# Patient Record
Sex: Female | Born: 2000 | Race: White | Hispanic: No | Marital: Single | Smoking: Never smoker
Health system: Southern US, Community
[De-identification: ages and names within clinical notes are randomized; demographics above are authoritative.]

## PROBLEM LIST (undated history)

## (undated) DIAGNOSIS — F419 Anxiety disorder, unspecified: Secondary | ICD-10-CM

## (undated) DIAGNOSIS — Z8489 Family history of other specified conditions: Secondary | ICD-10-CM

## (undated) DIAGNOSIS — F909 Attention-deficit hyperactivity disorder, unspecified type: Secondary | ICD-10-CM

## (undated) DIAGNOSIS — J45909 Unspecified asthma, uncomplicated: Secondary | ICD-10-CM

## (undated) HISTORY — DX: Unspecified asthma, uncomplicated: J45.909

## (undated) HISTORY — PX: NO PAST SURGERIES: SHX2092

---
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
## 2018-02-13 ENCOUNTER — Telehealth: Payer: Self-pay | Admitting: *Deleted

## 2018-02-16 NOTE — Telephone Encounter (Signed)
Needs appt for flu shot. 

## 2018-05-28 IMAGING — DX DG THORACIC SPINE 2V
4 series · 4 of 4 positions shown · non-contrast
Comparison: None.

CLINICAL DATA: Back pain, initial encounter

EXAM:
THORACIC SPINE 2 VIEWS

[t-spine ap (1 of 2)]
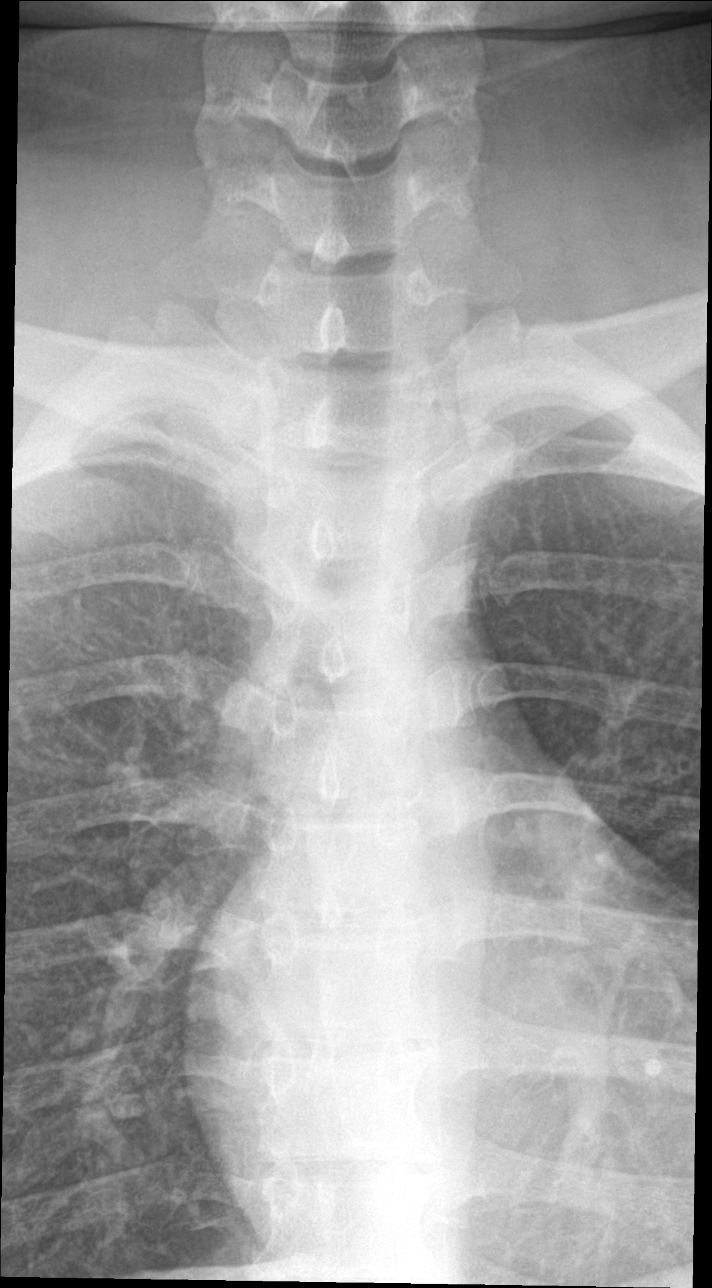

[t-spine lat]
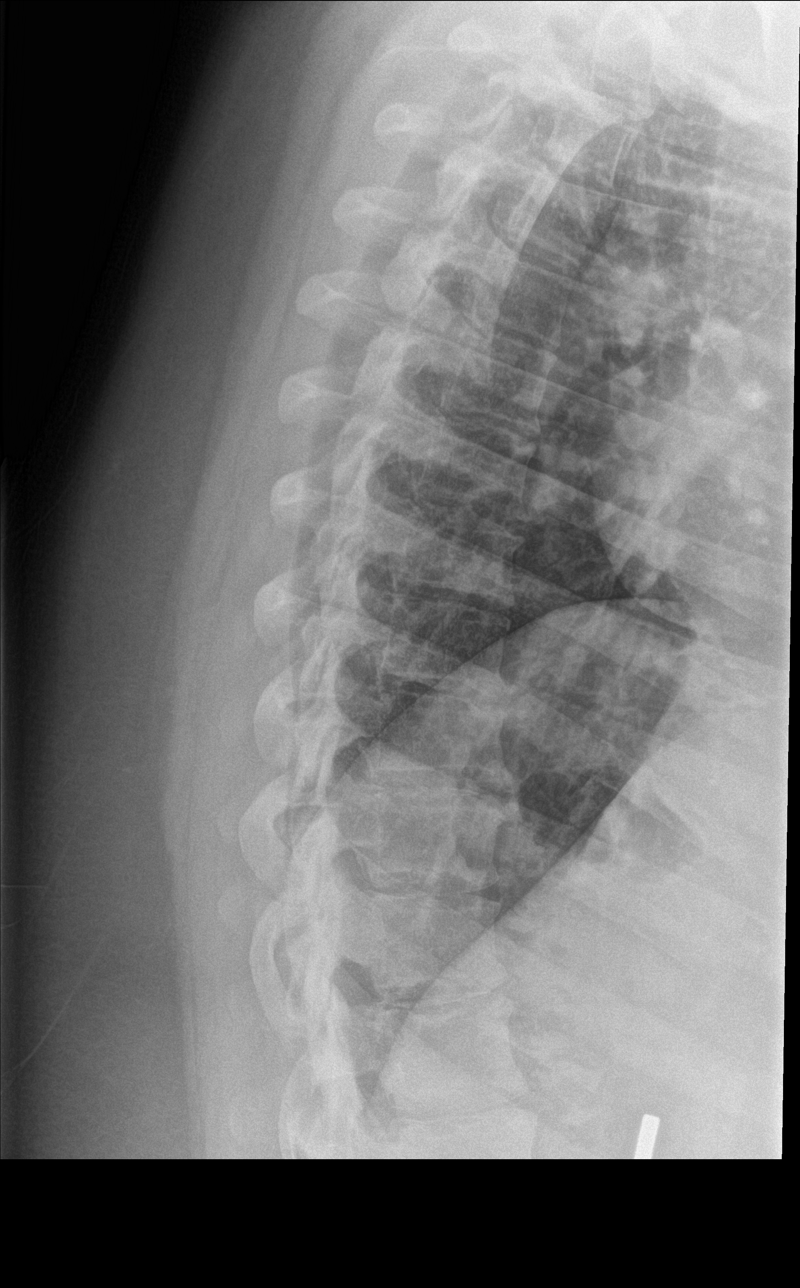

[t-spine lat swimmers]
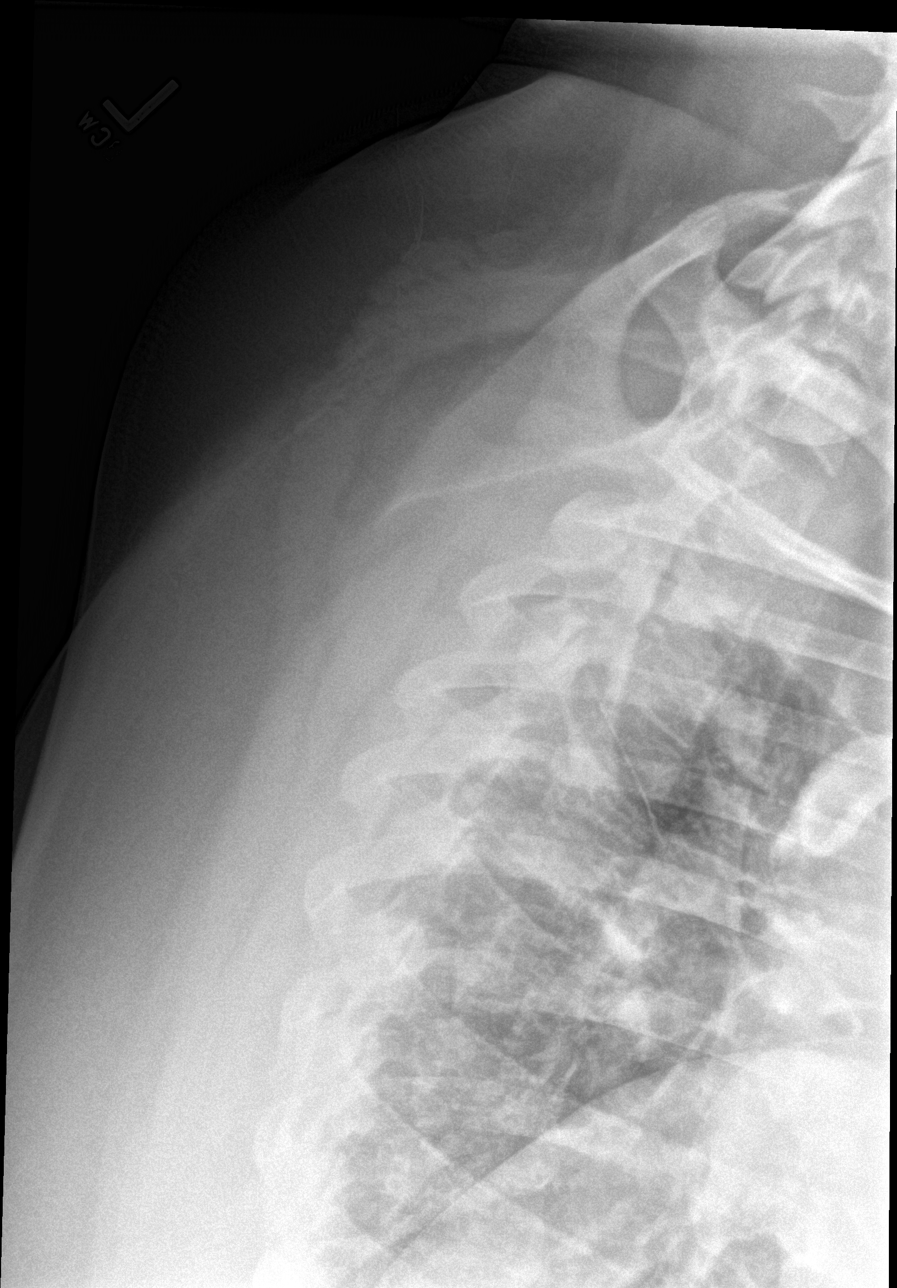

[t-spine ap (2 of 2)]
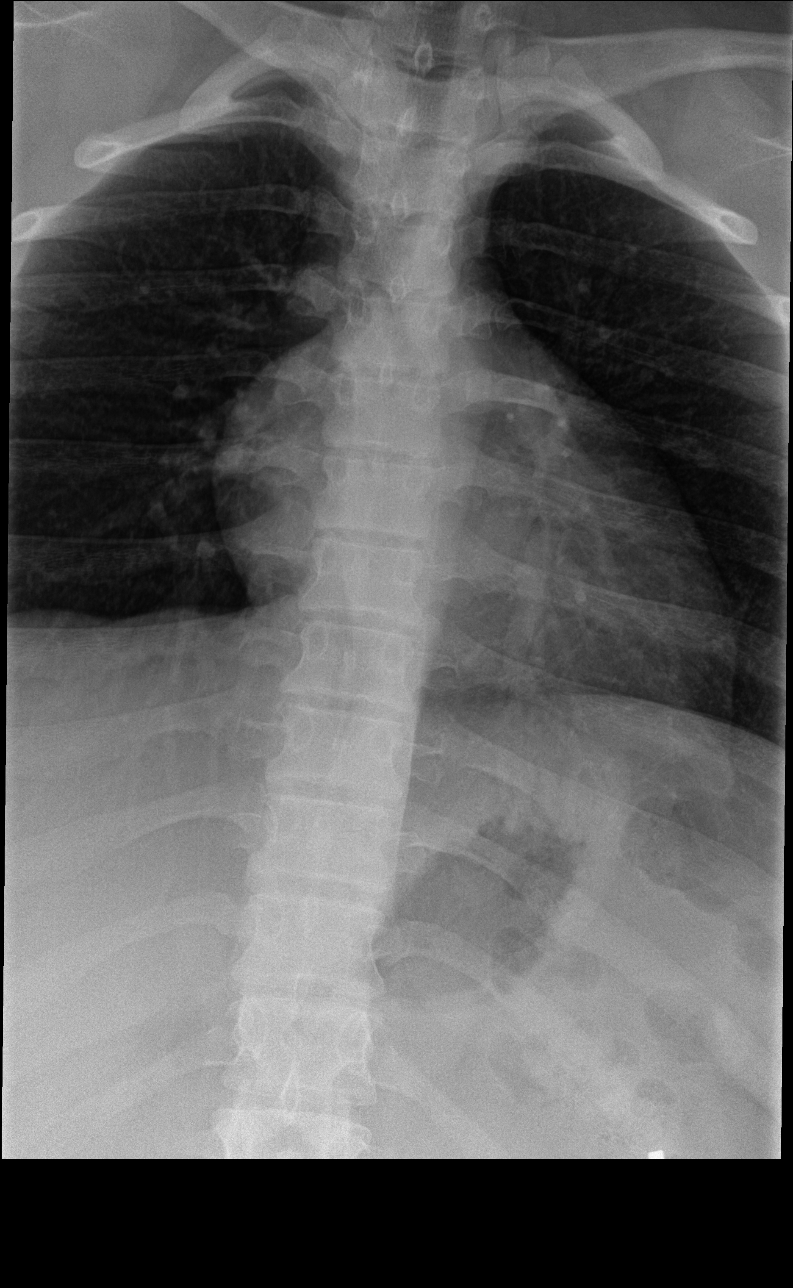

[4 of 4 positions shown; findings below may reference images not displayed]

FINDINGS: Pedicles are within normal limits. Mild osteophytic changes are
noted. No vertebral body height loss is seen. No paraspinal mass
lesion is noted.
IMPRESSION: Mild osteophytic change without acute abnormality.

## 2022-06-09 ENCOUNTER — Encounter (HOSPITAL_COMMUNITY)
Admission: EM | Disposition: A | Discharge status: Home / Self Care | Payer: Self-pay | Source: Home / Self Care | Attending: Family Medicine

## 2022-06-09 ENCOUNTER — Emergency Department (HOSPITAL_COMMUNITY): Payer: Medicaid Other

## 2022-06-09 ENCOUNTER — Emergency Department (HOSPITAL_COMMUNITY): Payer: Medicaid Other | Admitting: Anesthesiology

## 2022-06-09 ENCOUNTER — Other Ambulatory Visit: Payer: Self-pay

## 2022-06-09 ENCOUNTER — Encounter (HOSPITAL_BASED_OUTPATIENT_CLINIC_OR_DEPARTMENT_OTHER): Payer: Self-pay

## 2022-06-09 ENCOUNTER — Emergency Department (HOSPITAL_BASED_OUTPATIENT_CLINIC_OR_DEPARTMENT_OTHER): Payer: Medicaid Other

## 2022-06-09 ENCOUNTER — Inpatient Hospital Stay (HOSPITAL_BASED_OUTPATIENT_CLINIC_OR_DEPARTMENT_OTHER)
Admission: EM | Admit: 2022-06-09 | Discharge: 2022-06-12 | DRG: 660 | Disposition: A | Discharge status: Home / Self Care | Payer: Medicaid Other | Attending: Family Medicine | Admitting: Family Medicine

## 2022-06-09 DIAGNOSIS — J45909 Unspecified asthma, uncomplicated: Secondary | ICD-10-CM | POA: Diagnosis present

## 2022-06-09 DIAGNOSIS — Z83438 Family history of other disorder of lipoprotein metabolism and other lipidemia: Secondary | ICD-10-CM

## 2022-06-09 DIAGNOSIS — F319 Bipolar disorder, unspecified: Secondary | ICD-10-CM | POA: Diagnosis not present

## 2022-06-09 DIAGNOSIS — A419 Sepsis, unspecified organism: Secondary | ICD-10-CM

## 2022-06-09 DIAGNOSIS — N202 Calculus of kidney with calculus of ureter: Secondary | ICD-10-CM | POA: Diagnosis present

## 2022-06-09 DIAGNOSIS — N200 Calculus of kidney: Secondary | ICD-10-CM

## 2022-06-09 DIAGNOSIS — N179 Acute kidney failure, unspecified: Secondary | ICD-10-CM | POA: Insufficient documentation

## 2022-06-09 DIAGNOSIS — E860 Dehydration: Secondary | ICD-10-CM | POA: Diagnosis present

## 2022-06-09 DIAGNOSIS — Z6841 Body Mass Index (BMI) 40.0 and over, adult: Secondary | ICD-10-CM | POA: Diagnosis not present

## 2022-06-09 DIAGNOSIS — K760 Fatty (change of) liver, not elsewhere classified: Secondary | ICD-10-CM | POA: Diagnosis not present

## 2022-06-09 DIAGNOSIS — N138 Other obstructive and reflux uropathy: Secondary | ICD-10-CM

## 2022-06-09 DIAGNOSIS — R7303 Prediabetes: Secondary | ICD-10-CM | POA: Diagnosis present

## 2022-06-09 DIAGNOSIS — N1330 Unspecified hydronephrosis: Secondary | ICD-10-CM | POA: Diagnosis present

## 2022-06-09 DIAGNOSIS — N136 Pyonephrosis: Principal | ICD-10-CM | POA: Diagnosis present

## 2022-06-09 DIAGNOSIS — N139 Obstructive and reflux uropathy, unspecified: Secondary | ICD-10-CM | POA: Insufficient documentation

## 2022-06-09 DIAGNOSIS — N10 Acute tubulo-interstitial nephritis: Secondary | ICD-10-CM | POA: Insufficient documentation

## 2022-06-09 DIAGNOSIS — Z801 Family history of malignant neoplasm of trachea, bronchus and lung: Secondary | ICD-10-CM

## 2022-06-09 DIAGNOSIS — Z8249 Family history of ischemic heart disease and other diseases of the circulatory system: Secondary | ICD-10-CM

## 2022-06-09 DIAGNOSIS — D649 Anemia, unspecified: Secondary | ICD-10-CM | POA: Insufficient documentation

## 2022-06-09 DIAGNOSIS — E871 Hypo-osmolality and hyponatremia: Secondary | ICD-10-CM | POA: Insufficient documentation

## 2022-06-09 DIAGNOSIS — R739 Hyperglycemia, unspecified: Secondary | ICD-10-CM

## 2022-06-09 DIAGNOSIS — Z833 Family history of diabetes mellitus: Secondary | ICD-10-CM

## 2022-06-09 DIAGNOSIS — Z825 Family history of asthma and other chronic lower respiratory diseases: Secondary | ICD-10-CM

## 2022-06-09 DIAGNOSIS — N201 Calculus of ureter: Secondary | ICD-10-CM

## 2022-06-09 HISTORY — PX: CYSTOSCOPY W/ URETERAL STENT PLACEMENT: SHX1429

## 2022-06-09 LAB — COMPREHENSIVE METABOLIC PANEL
ALT: 14 U/L (ref 0–44)
AST: 13 U/L — ABNORMAL LOW (ref 15–41)
Albumin: 4 g/dL (ref 3.5–5.0)
Alkaline Phosphatase: 64 U/L (ref 38–126)
Anion gap: 13 (ref 5–15)
BUN: 5 mg/dL — ABNORMAL LOW (ref 6–20)
CO2: 25 mmol/L (ref 22–32)
Calcium: 9.6 mg/dL (ref 8.9–10.3)
Chloride: 95 mmol/L — ABNORMAL LOW (ref 98–111)
Creatinine, Ser: 0.93 mg/dL (ref 0.44–1.00)
GFR, Estimated: >60 mL/min (ref >60)
Glucose, Bld: 215 mg/dL — ABNORMAL HIGH (ref 70–99)
Potassium: 3.8 mmol/L (ref 3.5–5.1)
Sodium: 133 mmol/L — ABNORMAL LOW (ref 135–145)
Total Bilirubin: 0.9 mg/dL (ref 0.3–1.2)
Total Protein: 7.4 g/dL (ref 6.5–8.1)

## 2022-06-09 LAB — CBC
HCT: 40.4 % (ref 36.0–46.0)
Hemoglobin: 12.9 g/dL (ref 12.0–15.0)
MCH: 28.1 pg (ref 26.0–34.0)
MCHC: 31.9 g/dL (ref 30.0–36.0)
MCV: 88 fL (ref 80.0–100.0)
Platelets: 389 10*3/uL (ref 150–400)
RBC: 4.59 MIL/uL (ref 3.87–5.11)
RDW: 15.7 % — ABNORMAL HIGH (ref 11.5–15.5)
WBC: 31.5 10*3/uL — ABNORMAL HIGH (ref 4.0–10.5)
nRBC: 0 % (ref 0.0–0.2)

## 2022-06-09 LAB — URINALYSIS, MICROSCOPIC (REFLEX)

## 2022-06-09 LAB — URINALYSIS, ROUTINE W REFLEX MICROSCOPIC
Bilirubin Urine: NEGATIVE
Glucose, UA: NEGATIVE mg/dL
Ketones, ur: NEGATIVE mg/dL
Nitrite: NEGATIVE
Protein, ur: 100 mg/dL — AB
Specific Gravity, Urine: 1.015 (ref 1.005–1.030)
pH: 7 (ref 5.0–8.0)

## 2022-06-09 LAB — PREGNANCY, URINE: Preg Test, Ur: NEGATIVE

## 2022-06-09 LAB — LIPASE, BLOOD: Lipase: <10 U/L — ABNORMAL LOW (ref 11–51)

## 2022-06-09 LAB — HCG, SERUM, QUALITATIVE: Preg, Serum: NEGATIVE

## 2022-06-09 SURGERY — CYSTOSCOPY, WITH RETROGRADE PYELOGRAM AND URETERAL STENT INSERTION
Anesthesia: General | Laterality: Left

## 2022-06-09 MED ORDER — FENTANYL CITRATE PF 50 MCG/ML IJ SOSY
12.5000 ug | PREFILLED_SYRINGE | INTRAMUSCULAR | Status: DC | PRN
  Administered 2022-06-09 – 2022-06-10 (×3): 12.5 ug via INTRAVENOUS
  Filled 2022-06-09 (×5): qty 1

## 2022-06-09 MED ORDER — LACTATED RINGERS IV SOLN: INTRAVENOUS | Status: DC

## 2022-06-09 MED ORDER — MEPERIDINE HCL 50 MG/ML IJ SOLN
INTRAMUSCULAR | Status: AC
  Administered 2022-06-09: 12.5 mg via INTRAVENOUS
  Filled 2022-06-09: qty 1

## 2022-06-09 MED ORDER — PROPOFOL 10 MG/ML IV BOLUS
INTRAVENOUS | Status: AC
  Filled 2022-06-09: qty 20

## 2022-06-09 MED ORDER — ACETAMINOPHEN 10 MG/ML IV SOLN
INTRAVENOUS | Status: DC | PRN
  Administered 2022-06-09: 1000 mg via INTRAVENOUS

## 2022-06-09 MED ORDER — DEXAMETHASONE SODIUM PHOSPHATE 10 MG/ML IJ SOLN
INTRAMUSCULAR | Status: AC
  Filled 2022-06-09: qty 1

## 2022-06-09 MED ORDER — LACTATED RINGERS IV BOLUS
1000.0000 mL | Freq: Once | INTRAVENOUS | Status: AC
  Administered 2022-06-09: 1000 mL via INTRAVENOUS

## 2022-06-09 MED ORDER — ONDANSETRON HCL 4 MG/2ML IJ SOLN: 4.0000 mg | Freq: Once | INTRAMUSCULAR | Status: DC | PRN

## 2022-06-09 MED ORDER — CARIPRAZINE HCL 1.5 MG PO CAPS
1.5000 mg | ORAL_CAPSULE | Freq: Every day | ORAL | Status: DC
  Administered 2022-06-10 – 2022-06-12 (×3): 1.5 mg via ORAL
  Filled 2022-06-09 (×3): qty 1

## 2022-06-09 MED ORDER — OXYCODONE HCL 5 MG PO TABS
ORAL_TABLET | ORAL | Status: AC
  Filled 2022-06-09: qty 1

## 2022-06-09 MED ORDER — FENTANYL CITRATE PF 50 MCG/ML IJ SOSY
PREFILLED_SYRINGE | INTRAMUSCULAR | Status: AC
  Administered 2022-06-09: 25 ug via INTRAVENOUS
  Filled 2022-06-09: qty 2

## 2022-06-09 MED ORDER — SODIUM CHLORIDE 0.9 % IR SOLN
Status: DC | PRN
  Administered 2022-06-09: 3000 mL via INTRAVESICAL

## 2022-06-09 MED ORDER — MIDAZOLAM HCL 2 MG/2ML IJ SOLN
INTRAMUSCULAR | Status: DC | PRN
  Administered 2022-06-09: 2 mg via INTRAVENOUS

## 2022-06-09 MED ORDER — IOHEXOL 300 MG/ML  SOLN
INTRAMUSCULAR | Status: DC | PRN
  Administered 2022-06-09: 5 mL

## 2022-06-09 MED ORDER — MORPHINE SULFATE (PF) 4 MG/ML IV SOLN
4.0000 mg | Freq: Once | INTRAVENOUS | Status: AC
  Administered 2022-06-09: 4 mg via INTRAVENOUS
  Filled 2022-06-09: qty 1

## 2022-06-09 MED ORDER — ONDANSETRON HCL 4 MG/2ML IJ SOLN
4.0000 mg | Freq: Once | INTRAMUSCULAR | Status: AC
  Administered 2022-06-09: 4 mg via INTRAVENOUS
  Filled 2022-06-09: qty 2

## 2022-06-09 MED ORDER — OXYCODONE HCL 5 MG/5ML PO SOLN: 5.0000 mg | Freq: Once | ORAL | Status: AC | PRN

## 2022-06-09 MED ORDER — FENTANYL CITRATE (PF) 100 MCG/2ML IJ SOLN
INTRAMUSCULAR | Status: AC
  Filled 2022-06-09: qty 2

## 2022-06-09 MED ORDER — ONDANSETRON HCL 4 MG/2ML IJ SOLN: 4.0000 mg | Freq: Four times a day (QID) | INTRAMUSCULAR | Status: DC | PRN

## 2022-06-09 MED ORDER — SODIUM CHLORIDE 0.9 % IV SOLN
INTRAVENOUS | Status: AC
  Administered 2022-06-11: 1 g via INTRAVENOUS
  Filled 2022-06-09: qty 10

## 2022-06-09 MED ORDER — OXYCODONE HCL 5 MG PO TABS
5.0000 mg | ORAL_TABLET | Freq: Once | ORAL | Status: AC | PRN
  Administered 2022-06-09: 5 mg via ORAL

## 2022-06-09 MED ORDER — FENTANYL CITRATE PF 50 MCG/ML IJ SOSY
25.0000 ug | PREFILLED_SYRINGE | INTRAMUSCULAR | Status: DC | PRN
  Administered 2022-06-09: 50 ug via INTRAVENOUS
  Administered 2022-06-09: 25 ug via INTRAVENOUS
  Administered 2022-06-09: 50 ug via INTRAVENOUS

## 2022-06-09 MED ORDER — ONDANSETRON HCL 4 MG/2ML IJ SOLN
INTRAMUSCULAR | Status: DC | PRN
  Administered 2022-06-09: 4 mg via INTRAVENOUS

## 2022-06-09 MED ORDER — PROPOFOL 500 MG/50ML IV EMUL
INTRAVENOUS | Status: DC | PRN
  Administered 2022-06-09: 220 mg via INTRAVENOUS

## 2022-06-09 MED ORDER — DEXMEDETOMIDINE HCL IN NACL 80 MCG/20ML IV SOLN
INTRAVENOUS | Status: AC
  Filled 2022-06-09: qty 20

## 2022-06-09 MED ORDER — MIDAZOLAM HCL 2 MG/2ML IJ SOLN
INTRAMUSCULAR | Status: AC
  Filled 2022-06-09: qty 2

## 2022-06-09 MED ORDER — 0.9 % SODIUM CHLORIDE (POUR BTL) OPTIME
TOPICAL | Status: DC | PRN
  Administered 2022-06-09: 1000 mL

## 2022-06-09 MED ORDER — DEXAMETHASONE SODIUM PHOSPHATE 10 MG/ML IJ SOLN
INTRAMUSCULAR | Status: DC | PRN
  Administered 2022-06-09: 10 mg via INTRAVENOUS

## 2022-06-09 MED ORDER — ONDANSETRON HCL 4 MG/2ML IJ SOLN
INTRAMUSCULAR | Status: AC
  Filled 2022-06-09: qty 2

## 2022-06-09 MED ORDER — CHLORHEXIDINE GLUCONATE 0.12 % MT SOLN
15.0000 mL | Freq: Once | OROMUCOSAL | Status: AC
  Administered 2022-06-09: 15 mL via OROMUCOSAL

## 2022-06-09 MED ORDER — FENTANYL CITRATE (PF) 100 MCG/2ML IJ SOLN
INTRAMUSCULAR | Status: DC | PRN
  Administered 2022-06-09: 100 ug via INTRAVENOUS

## 2022-06-09 MED ORDER — SODIUM CHLORIDE 0.9 % IV SOLN
1.0000 g | Freq: Once | INTRAVENOUS | Status: AC
  Administered 2022-06-09: 1 g via INTRAVENOUS
  Filled 2022-06-09: qty 10

## 2022-06-09 MED ORDER — DEXMEDETOMIDINE HCL IN NACL 80 MCG/20ML IV SOLN
INTRAVENOUS | Status: DC | PRN
  Administered 2022-06-09: 8 ug via BUCCAL
  Administered 2022-06-09: 12 ug via BUCCAL

## 2022-06-09 MED ORDER — SODIUM CHLORIDE 0.9 % IV SOLN
1.0000 g | INTRAVENOUS | Status: DC
  Administered 2022-06-10 – 2022-06-12 (×2): 1 g via INTRAVENOUS
  Filled 2022-06-09 (×3): qty 10

## 2022-06-09 MED ORDER — DEXTROSE 5 % IV SOLN
INTRAVENOUS | Status: DC | PRN
  Administered 2022-06-09: 1 g via INTRAVENOUS

## 2022-06-09 MED ORDER — FENTANYL CITRATE PF 50 MCG/ML IJ SOSY
PREFILLED_SYRINGE | INTRAMUSCULAR | Status: AC
  Filled 2022-06-09: qty 1

## 2022-06-09 MED ORDER — MEPERIDINE HCL 50 MG/ML IJ SOLN
6.2500 mg | INTRAMUSCULAR | Status: DC | PRN
  Administered 2022-06-09: 12.5 mg via INTRAVENOUS

## 2022-06-09 MED ORDER — ACETAMINOPHEN 10 MG/ML IV SOLN
INTRAVENOUS | Status: AC
  Filled 2022-06-09: qty 100

## 2022-06-09 MED ORDER — KETOROLAC TROMETHAMINE 30 MG/ML IJ SOLN
30.0000 mg | Freq: Once | INTRAMUSCULAR | Status: AC
  Administered 2022-06-09: 30 mg via INTRAVENOUS
  Filled 2022-06-09: qty 1

## 2022-06-09 MED ORDER — LIDOCAINE 2% (20 MG/ML) 5 ML SYRINGE
INTRAMUSCULAR | Status: DC | PRN
  Administered 2022-06-09: 100 mg via INTRAVENOUS

## 2022-06-09 SURGICAL SUPPLY — 17 items
BAG URO CATCHER STRL LF (MISCELLANEOUS) ×1 IMPLANT
BULB IRRIG PATHFIND (MISCELLANEOUS) IMPLANT
CATH FOLEY 2WAY SLVR  5CC 16FR (CATHETERS) ×1
CATH FOLEY 2WAY SLVR 5CC 16FR (CATHETERS) IMPLANT
CATH URETL OPEN END 6FR 70 (CATHETERS) ×1 IMPLANT
CLOTH BEACON ORANGE TIMEOUT ST (SAFETY) ×1 IMPLANT
GLOVE SURG LX STRL 7.5 STRW (GLOVE) ×1 IMPLANT
GOWN STRL REUS W/ TWL XL LVL3 (GOWN DISPOSABLE) ×1 IMPLANT
GOWN STRL REUS W/TWL XL LVL3 (GOWN DISPOSABLE) ×1
GUIDEWIRE STR DUAL SENSOR (WIRE) ×1 IMPLANT
KIT TURNOVER KIT A (KITS) IMPLANT
MANIFOLD NEPTUNE II (INSTRUMENTS) ×1 IMPLANT
PACK CYSTO (CUSTOM PROCEDURE TRAY) ×1 IMPLANT
STENT URET 6FRX26 CONTOUR (STENTS) IMPLANT
SYR 20ML LL LF (SYRINGE) ×1 IMPLANT
TUBING CONNECTING 10 (TUBING) ×1 IMPLANT
TUBING UROLOGY SET (TUBING) IMPLANT

## 2022-06-09 NOTE — Assessment & Plan Note (Addendum)
-  secondary to obstructive uropathy as evidenced with tachycardia, leukocytosis and CT renal stone study finding of mark left sided perinephric stranding with 42mm left UPJ calculus leading to mild LEFT hydronephrosis and signs of moderate to marked renal edema.  -creatinine stable -S/P urgent left JJ stent with urologist Dr. Milford Cage  -continue IV Rocephin pending urine culture -urology will continue to follow. She will eventually need  definitive management for her stone with either lithotripsy or retrograde ureteroscopy and laser lithotripsy.

## 2022-06-09 NOTE — ED Provider Notes (Signed)
[facility] Provider Note   CSN: [id number] Arrival date & time: 06/09/22  1233     History  Chief Complaint  Patient presents with   Abdominal Pain    Katie Ellis is a 22 y.o. female.  HPI   22 year old female presents emergency department with abdominal/flank pain, nausea/vomiting.  Patient states that she has a known kidney stone that is being followed by her primary doctor.  She was given referral for urology however was not able to establish an appointment sooner than 6 weeks from now.  She is also now unable to tolerate any medications due to severe nausea.  She is complaining of worsening pain, difficulty with urination, fever/chills.  Outside of this current episode she has no previous history of kidney stone/surgery.  Home Medications Prior to Admission medications   Medication Sig Start Date End Date Taking? Authorizing Provider  amoxicillin-clavulanate (AUGMENTIN) 875-125 MG tablet Take 1 tablet by mouth 2 (two) times daily. 12/11/17   Claretta Fraise, MD  pseudoephedrine-guaifenesin (MUCINEX D) 60-600 MG 12 hr tablet Take 1 tablet by mouth every 12 (twelve) hours. 12/11/17   Claretta Fraise, MD      Allergies    Patient has no known allergies.    Review of Systems   Review of Systems  Constitutional:  Positive for appetite change, chills, fatigue and fever.  Respiratory:  Negative for shortness of breath.   Cardiovascular:  Negative for chest pain.  Gastrointestinal:  Positive for abdominal pain, nausea and vomiting. Negative for diarrhea.  Genitourinary:  Positive for decreased urine volume, difficulty urinating, dysuria and flank pain.  Skin:  Negative for rash.  Neurological:  Negative for headaches.    Physical Exam Updated Vital Signs BP 136/83   Pulse (!) 137   Temp 99 F (37.2 C) (Oral)   Resp 18   Ht 5\' 6"  (1.676 m)   Wt 132 kg   SpO2 100%   BMI 46.97 kg/m  Physical Exam Vitals and nursing note  reviewed.  Constitutional:      Appearance: Normal appearance. She is obese.  HENT:     Head: Normocephalic.     Mouth/Throat:     Mouth: Mucous membranes are moist.  Cardiovascular:     Rate and Rhythm: Tachycardia present.  Pulmonary:     Effort: Pulmonary effort is normal. No respiratory distress.  Abdominal:     General: Bowel sounds are decreased.     Palpations: Abdomen is soft.     Tenderness: There is abdominal tenderness in the suprapubic area.  Skin:    General: Skin is warm.  Neurological:     Mental Status: She is alert and oriented to person, place, and time. Mental status is at baseline.  Psychiatric:        Mood and Affect: Mood normal.     ED Results / Procedures / Treatments   Labs (all labs ordered are listed, but only abnormal results are displayed) Labs Reviewed  CBC - Abnormal; Notable for the following components:      Result Value   WBC 31.5 (*)    RDW 15.7 (*)    All other components within normal limits  LIPASE, BLOOD  COMPREHENSIVE METABOLIC PANEL  URINALYSIS, ROUTINE W REFLEX MICROSCOPIC  PREGNANCY, URINE  HCG, SERUM, QUALITATIVE    EKG EKG Interpretation  Date/Time:  Thursday June 09 2022 13:08:05 EDT Ventricular Rate:  149 PR Interval:  116 QRS Duration: 83 QT Interval:  279  QTC Calculation: 440 R Axis:   45 Text Interpretation: Sinus tachycardia Low voltage, precordial leads Borderline T abnormalities, anterior leads Confirmed by Lavenia Atlas [phone]) on 06/09/2022 1:51:27 PM  Radiology No results found.  Procedures Procedures    Medications Ordered in ED Medications  lactated ringers bolus 1,000 mL (1,000 mLs Intravenous New Bag/Given 06/09/22 1308)    ED Course/ Medical Decision Making/ A&P                             Medical Decision Making Amount and/or Complexity of Data Reviewed Labs: ordered. Radiology: ordered.  Risk Prescription drug management. Decision regarding hospitalization.   22 year old female  presents emergency department with concern with ongoing kidney stone.  This is reportedly been diagnosed and is being followed by her primary doctor, she has not been able to establish care with urology.  She comes in complaining of worsening left-sided pain, nausea/vomiting, chills.  Patient is tachycardic on arrival, afebrile, stable blood pressure.  She appears uncomfortable and is tender on the left side of the abdomen.  Blood work shows a leukocytosis of 31, kidney function is normal.  Urinalysis shows blood, nitrate negative, no significant amount bacteria/WBCs.  CT shows a 5 mm left-sided obstructive stone with left hydronephrosis and perinephric stranding/inflammation.  Spoke with on-call urologist Dr. Milford Cage, he recommends transfer straight to PACU for stent placement.  Patient and mother state that the last time she had anything to eat/drink was sips of water at noon.  Last food intake was 2 days ago.  They have been notified that she is to be NPO.  Will speak with hospitalist for admission after stent procedure.  Dose of Rocephin ordered, patient requiring IV pain medicine, tachycardia improved but not resolved.  Patients evaluation and results requires admission for further treatment and care.  Spoke with hospitalist, reviewed patient's ED course and they accept admission.  Patient agrees with admission plan, offers no new complaints and is stable/unchanged at time of admit.        Final Clinical Impression(s) / ED Diagnoses Final diagnoses:  None    Rx / DC Orders ED Discharge Orders     None         Lorelle Gibbs, DO 06/09/22 1522

## 2022-06-09 NOTE — ED Triage Notes (Signed)
Reports has a 4mm kidney stone on left side and its moved some. Has some severe abdominal pain on LLQ.

## 2022-06-09 NOTE — ED Notes (Signed)
Pt unable to urinate at this time, states went PTA

## 2022-06-09 NOTE — Assessment & Plan Note (Signed)
BMI of greater than 46

## 2022-06-09 NOTE — Op Note (Signed)
Preoperative diagnosis:  1.  Left proximal ureteral stone with urosepsis   Postoperative diagnosis: 1.  Same  Procedure(s): 1.  Cystoscopy, left retrograde pyelogram with intraoperative interpretation, insertion left JJ stent  Surgeon: Dr. Harold Barban  Anesthesia: General  Complications: None  EBL: Minimal  Specimens: None  Disposition of specimens: Not applicable  Intraoperative findings: 5 mm proximal ureteral calculus.  To migrate proximally insertion of 6 French by 26 cm Percuflex plus soft contour stent.  Purulent drainage noted through and around the stent.  39 French Foley placed  Indication: Patient is a 22 year old white female who has had left proximal ureteral calculus for approximately 3 weeks and is attempted medical management.  She presented with worsening pain and marked leukocytosis and tachycardia consistent with impending urosepsis.  Presents at this time undergo cystoscopy insertion left JJ stent  Description of procedure:  After obtaining informed consent for the patient she was taken the major cystoscopy suite placed under general anesthesia.  She was placed in dorsolithotomy position genitalia prepped and draped in usual sterile fashion.  Proper pause and timeout was performed for site of procedure.  17 French cystoscope was advanced in the bladder.  There is marked hemorrhagic areas within the urinary bladder consistent with cystitis.  No other bladder lesions were noted.  Right ureteral orifice was effluxing clear urine.  Left ureteral orifice was identified.  Open tip catheter was passed through the working channel of the cystoscope and positioned just outside the ureteral orifice.  Sensor wire was directed inside the left ureteral orifice and advanced up to the renal pelvis under fluoroscopy.  The stone could be seen in the region of the left proximal ureter.  I advanced the open tip catheter just beneath the stone and remove the wire.  Gentle retrograde  pyelogram confirmed filling defect in the left proximal ureter consistent with stone seen on CT scan.  I then advanced sensor wire into the renal pelvis and remove the open tip catheter leave the guidewire in place.  The stent seem to migrate proximally with passage of the wire.  A 6 French by 26 cm Percuflex plus soft contour stent was then placed leaving a proximal coil in the renal pelvis and distal coil in the bladder.  Wire was removed.  43 French Foley was placed procedure terminated.  She was awakened from anesthesia and taken back to recovery room in stable condition.  She was somewhat tachycardic and febrile to 101 at termination of the procedure but again vital signs were stable.  Ackley need hemodynamic monitoring overnight with continued IV antibiotics.

## 2022-06-09 NOTE — Hospital Course (Addendum)
Katie Ellis is a 22 y.o. F with morbid obesity, asthma who presented with left sided obstructing stone and pyelonephritis.

## 2022-06-09 NOTE — Anesthesia Procedure Notes (Signed)
Procedure Name: LMA Insertion Date/Time: 06/09/2022 5:51 PM  Performed by: Gerald Leitz, CRNAPre-anesthesia Checklist: Patient identified, Patient being monitored, Timeout performed, Emergency Drugs available and Suction available Patient Re-evaluated:Patient Re-evaluated prior to induction Oxygen Delivery Method: Circle system utilized Preoxygenation: Pre-oxygenation with 100% oxygen Induction Type: IV induction Ventilation: Mask ventilation without difficulty LMA: LMA inserted LMA Size: 4.0 Tube type: Oral Number of attempts: 1 Placement Confirmation: positive ETCO2 and breath sounds checked- equal and bilateral Tube secured with: Tape Dental Injury: Teeth and Oropharynx as per pre-operative assessment

## 2022-06-09 NOTE — Anesthesia Postprocedure Evaluation (Signed)
Anesthesia Post Note  Patient: Katie Ellis  Procedure(s) Performed: CYSTOSCOPY WITH RETROGRADE PYELOGRAM/URETERAL STENT PLACEMENT (Left)     Patient location during evaluation: PACU Anesthesia Type: General Level of consciousness: awake and alert Pain management: pain level controlled Vital Signs Assessment: post-procedure vital signs reviewed and stable Respiratory status: spontaneous breathing, nonlabored ventilation, respiratory function stable and patient connected to nasal cannula oxygen Cardiovascular status: blood pressure returned to baseline and stable Postop Assessment: no apparent nausea or vomiting Anesthetic complications: no  No notable events documented.  Last Vitals:  Vitals:   06/09/22 1845 06/09/22 1900  BP: (!) 110/44 121/89  Pulse: (!) 117 (!) 122  Resp: (!) 28 (!) 22  Temp:    SpO2: 100% 97%    Last Pain:  Vitals:   06/09/22 1900  TempSrc:   PainSc: 6                  [name]

## 2022-06-09 NOTE — Transfer of Care (Signed)
Immediate Anesthesia Transfer of Care Note  Patient: Katie Ellis  Procedure(s) Performed: CYSTOSCOPY WITH RETROGRADE PYELOGRAM/URETERAL STENT PLACEMENT (Left)  Patient Location: PACU  Anesthesia Type:General  Level of Consciousness: drowsy and patient cooperative  Airway & Oxygen Therapy: Patient Spontanous Breathing and Patient connected to face mask oxygen  Post-op Assessment: Report given to RN and Post -op Vital signs reviewed and stable  Post vital signs: Reviewed and stable  Last Vitals:  Vitals Value Taken Time  BP 141/51 06/09/22 1830  Temp    Pulse 114 06/09/22 1833  Resp 30 06/09/22 1833  SpO2 100 % 06/09/22 1833  Vitals shown include unvalidated device data.  Last Pain:  Vitals:   06/09/22 1700  TempSrc: Oral  PainSc: 7          Complications: No notable events documented.

## 2022-06-09 NOTE — Assessment & Plan Note (Addendum)
Prediabetes, A1c 6.4%

## 2022-06-09 NOTE — Anesthesia Preprocedure Evaluation (Signed)
Anesthesia Evaluation  Patient identified by MRN, date of birth, ID band Patient awake    Reviewed: Allergy & Precautions, H&P , NPO status , Patient's Chart, lab work & pertinent test results  Airway Mallampati: III  TM Distance: <3 FB Neck ROM: Full    Dental no notable dental hx.    Pulmonary neg pulmonary ROS   breath sounds clear to auscultation       Cardiovascular negative cardio ROS Normal cardiovascular exam Rhythm:Regular Rate:Normal     Neuro/Psych negative neurological ROS  negative psych ROS   GI/Hepatic negative GI ROS, Neg liver ROS,,,  Endo/Other    Morbid obesity  Renal/GU negative Renal ROS  negative genitourinary   Musculoskeletal negative musculoskeletal ROS (+)    Abdominal  (+) + obese  Peds negative pediatric ROS (+)  Hematology negative hematology ROS (+)   Anesthesia Other Findings   Reproductive/Obstetrics negative OB ROS                             Anesthesia Physical Anesthesia Plan  ASA: 3  Anesthesia Plan: General   Post-op Pain Management: Minimal or no pain anticipated   Induction: Intravenous  PONV Risk Score and Plan: 3 and Ondansetron, Dexamethasone and Treatment may vary due to age or medical condition  Airway Management Planned: LMA  Additional Equipment:   Intra-op Plan:   Post-operative Plan: Extubation in OR  Informed Consent: I have reviewed the patients History and Physical, chart, labs and discussed the procedure including the risks, benefits and alternatives for the proposed anesthesia with the patient or authorized representative who has indicated his/her understanding and acceptance.     Dental advisory given  Plan Discussed with: CRNA and Surgeon  Anesthesia Plan Comments:        Anesthesia Quick Evaluation

## 2022-06-09 NOTE — H&P (Signed)
Urology Consult   Physician requesting consult: Dr. Loleta Books  Reason for consult: Left ureteral calculus  History of Present Illness: Katie Ellis is a 22 y.o. white female who has had known left renal calculus since 05/16/2022.  Set off and on pain with nausea and vomiting but got more severe during the day today.  Was seen at the emergency room and CT urogram confirmed a 5 mm proximal ureteral calculus with perirenal stranding.  Patient has had uncontrolled pain nausea vomiting but no fever.  White blood cell count however is 30,000.  She presents at this time undergo cystoscopy insertion of left JJ stent followed by admission to the hospital service for IV antibiotics.  Will eventually need definitive management for her stone with either lithotripsy or retrograde ureteroscopy and laser lithotripsy.  She denies a history of voiding or storage urinary symptoms, hematuria, UTIs, STDs, urolithiasis, GU malignancy/trauma/surgery.  Past Medical History:  Diagnosis Date   Asthma     History reviewed. No pertinent surgical history.   Current Hospital Medications:  Home meds:  No current facility-administered medications on file prior to encounter.   Current Outpatient Medications on File Prior to Encounter  Medication Sig Dispense Refill   amoxicillin-clavulanate (AUGMENTIN) 875-125 MG tablet Take 1 tablet by mouth 2 (two) times daily. 20 tablet 0   HYDROcodone-acetaminophen (NORCO/VICODIN) 5-325 MG tablet Take 1 tablet by mouth every 6 (six) hours as needed for moderate pain.     pseudoephedrine-guaifenesin (MUCINEX D) 60-600 MG 12 hr tablet Take 1 tablet by mouth every 12 (twelve) hours. 12 tablet 0     Scheduled Meds: Continuous Infusions:  lactated ringers 10 mL/hr at 06/09/22 1711   PRN Meds:.  Allergies: No Known Allergies  Family History  Problem Relation Age of Onset   Diabetes Mother    Hypertension Mother    Hyperlipidemia Mother    Cancer Mother        lung   COPD  Father     Social History:  reports that she is a non-smoker but has been exposed to tobacco smoke. She has never used smokeless tobacco. She reports current drug use. Drug: Marijuana. She reports that she does not drink alcohol.  ROS: A complete review of systems was performed.  All systems are negative except for pertinent findings as noted.  Physical Exam:  Vital signs in last 24 hours: Temp:  [98.5 F (36.9 C)-99 F (37.2 C)] 98.5 F (36.9 C) (03/21 1700) Pulse Rate:  [128-165] 148 (03/21 1700) Resp:  [16-26] 16 (03/21 1700) BP: (121-146)/(75-93) 146/93 (03/21 1700) SpO2:  [97 %-100 %] 97 % (03/21 1700) Weight:  [132 kg] 132 kg (03/21 1700) Constitutional: Obese white female alert and oriented, No acute distress Cardiovascular: Regular rate and rhythm, No JVD Respiratory: Normal respiratory effort, Lungs clear bilaterally GI: Abdomen is soft, nontender, nondistended, no abdominal masses GU: Left CVA tenderness Lymphatic: No lymphadenopathy Neurologic: Grossly intact, no focal deficits Psychiatric: Normal mood and affect  Laboratory Data:  Recent Labs    06/09/22 1307  WBC 31.5*  HGB 12.9  HCT 40.4  PLT 389    Recent Labs    06/09/22 1307  NA 133*  K 3.8  CL 95*  GLUCOSE 215*  BUN 5*  CALCIUM 9.6  CREATININE 0.93     Results for orders placed or performed during the hospital encounter of 06/09/22 (from the past 24 hour(s))  Lipase, blood     Status: Abnormal   Collection Time: 06/09/22  1:07  PM  Result Value Ref Range   Lipase <10 (L) 11 - 51 U/L  Comprehensive metabolic panel     Status: Abnormal   Collection Time: 06/09/22  1:07 PM  Result Value Ref Range   Sodium 133 (L) 135 - 145 mmol/L   Potassium 3.8 3.5 - 5.1 mmol/L   Chloride 95 (L) 98 - 111 mmol/L   CO2 25 22 - 32 mmol/L   Glucose, Bld 215 (H) 70 - 99 mg/dL   BUN 5 (L) 6 - 20 mg/dL   Creatinine, Ser 0.93 0.44 - 1.00 mg/dL   Calcium 9.6 8.9 - 10.3 mg/dL   Total Protein 7.4 6.5 - 8.1 g/dL    Albumin 4.0 3.5 - 5.0 g/dL   AST 13 (L) 15 - 41 U/L   ALT 14 0 - 44 U/L   Alkaline Phosphatase 64 38 - 126 U/L   Total Bilirubin 0.9 0.3 - 1.2 mg/dL   GFR, Estimated >60 >60 mL/min   Anion gap 13 5 - 15  CBC     Status: Abnormal   Collection Time: 06/09/22  1:07 PM  Result Value Ref Range   WBC 31.5 (H) 4.0 - 10.5 K/uL   RBC 4.59 3.87 - 5.11 MIL/uL   Hemoglobin 12.9 12.0 - 15.0 g/dL   HCT 40.4 36.0 - 46.0 %   MCV 88.0 80.0 - 100.0 fL   MCH 28.1 26.0 - 34.0 pg   MCHC 31.9 30.0 - 36.0 g/dL   RDW 15.7 (H) 11.5 - 15.5 %   Platelets 389 150 - 400 K/uL   nRBC 0.0 0.0 - 0.2 %  Pregnancy serum, qualitative     Status: None   Collection Time: 06/09/22  1:36 PM  Result Value Ref Range   Preg, Serum NEGATIVE NEGATIVE  Urinalysis, Routine w reflex microscopic -Urine, Clean Catch     Status: Abnormal   Collection Time: 06/09/22  2:21 PM  Result Value Ref Range   Color, Urine YELLOW YELLOW   APPearance CLEAR CLEAR   Specific Gravity, Urine 1.015 1.005 - 1.030   pH 7.0 5.0 - 8.0   Glucose, UA NEGATIVE NEGATIVE mg/dL   Hgb urine dipstick MODERATE (A) NEGATIVE   Bilirubin Urine NEGATIVE NEGATIVE   Ketones, ur NEGATIVE NEGATIVE mg/dL   Protein, ur 100 (A) NEGATIVE mg/dL   Nitrite NEGATIVE NEGATIVE   Leukocytes,Ua SMALL (A) NEGATIVE  Pregnancy, urine     Status: None   Collection Time: 06/09/22  2:21 PM  Result Value Ref Range   Preg Test, Ur NEGATIVE NEGATIVE  Urinalysis, Microscopic (reflex)     Status: Abnormal   Collection Time: 06/09/22  2:21 PM  Result Value Ref Range   RBC / HPF 0-5 0 - 5 RBC/hpf   WBC, UA 0-5 0 - 5 WBC/hpf   Bacteria, UA FEW (A) NONE SEEN   Squamous Epithelial / HPF 0-5 0 - 5 /HPF   Non Squamous Epithelial PRESENT (A) NONE SEEN   No results found for this or any previous visit (from the past 240 hour(s)).  Renal Function: Recent Labs    06/09/22 1307  CREATININE 0.93   Estimated Creatinine Clearance: 130.2 mL/min (by C-G formula based on SCr of 0.93  mg/dL).  Radiologic Imaging: CT Renal Stone Study  Result Date: 06/09/2022 CLINICAL DATA:  22 year old female presents for evaluation of abdominal pain and flank pain with suspected kidney stone. EXAM: CT ABDOMEN AND PELVIS WITHOUT CONTRAST TECHNIQUE: Multidetector CT imaging of the abdomen and pelvis  was performed following the standard protocol without IV contrast. RADIATION DOSE REDUCTION: This exam was performed according to the departmental dose-optimization program which includes automated exposure control, adjustment of the mA and/or kV according to patient size and/or use of iterative reconstruction technique. COMPARISON:  None available. FINDINGS: Lower chest: Incidental imaging of the lung bases without effusion or sign of consolidative change. No chest wall abnormality. Hepatobiliary: Severe hepatic steatosis with fatty sparing about the gallbladder fossa. Signs of hepatomegaly liver measuring approximately 20 cm greatest craniocaudal dimension. Fissural widening of hepatic fissures. No pericholecystic stranding or gross biliary duct dilation. Pancreas: Normal, without mass, inflammation or ductal dilatation. Spleen: Normal. Adrenals/Urinary Tract: Adrenal glands are normal. Moderate to marked LEFT-sided perinephric stranding with 5 mm LEFT UPJ calculus leading to mild LEFT hydronephrosis and signs of renal edema. No additional urinary tract calculi on either the RIGHT or LEFT. Smooth renal contours. Urinary bladder under distended. Mild perivesical stranding. Stomach/Bowel: Stomach under distended. No sign of small bowel obstruction or acute small bowel process. Appendix is normal. Colon without signs of obstruction or inflammation. Vascular/Lymphatic: Aorta with smooth contours. IVC with smooth contours. No aneurysmal dilation of the abdominal aorta. There is no gastrohepatic or hepatoduodenal ligament lymphadenopathy. No retroperitoneal or mesenteric lymphadenopathy. No pelvic sidewall  lymphadenopathy. Limited assessment of vascular structures due to lack of intravenous contrast. Reproductive: Unremarkable by CT. Other: No ascites.  No pneumoperitoneum. Musculoskeletal: No acute or significant osseous findings. IMPRESSION: 1. Moderate to marked LEFT-sided perinephric stranding with 5 mm LEFT UPJ calculus leading to mild LEFT hydronephrosis and signs of moderate to marked renal edema. Signs of obstructive uropathy which may be worse than the degree of hydronephrosis suggest currently. Given the degree of stranding and edema about the kidney and stranding about the urinary bladder would also suggest correlation with any signs of urinary tract infection. 2. Severe hepatic steatosis and hepatomegaly with fissural widening of hepatic fissures. Findings could reflect early liver disease. Correlate with signs of nonalcoholic steatohepatitis and with any current evidence of liver disease or metabolic syndrome. Electronically Signed   By: Zetta Bills M.D.   On: 06/09/2022 14:54    I independently reviewed the above imaging studies.  Impression/Recommendation: 1.  Left proximal ureteral calculus with severe leukocytosis intractable pain Plan/recommendation.  Plan for cystoscopy insertion of urgent left JJ stent.  She will be admitted to the hospital for observation monitoring for fever follow-up of her leukocytosis risk and benefits of procedure were discussed in detail today including risk of bleeding infection damage to bladder ureter possible need for nephrostomy tube if unable to place ureteral stent.  Patient agreeable to proceed  Remi Haggard 06/09/2022, 5:27 PM     CC:

## 2022-06-09 NOTE — Progress Notes (Signed)
Katie Ellis is a 22 y.o. F with morbid obesity, asthma who presented with left flank pain and abdominal pain.  Evidently has had symptoms ~1 month, saw PCP, given Flomax, Vicodin, referred to Urology, hasn't been yet.  In the meantime, symptoms markedly worse acutely.  In the ER, HR 160 on arrival, tachynpeic, BP normal.  ECG showed sinus tachycardia 149 bpm.  CT renal protocol showed marked LEFT-sided perinephric stranding, 46mm UPJ stone, and hydronephrosis.   Cr normal.  WBC 31K.  UA actually clean.  Got Rocephin, fluids.  To progressive care.  Urology Dr. Milford Cage will arrange for stenting as soon as able.  EDP will assess for need for more fluids, given tachcyardia.

## 2022-06-09 NOTE — H&P (Addendum)
History and Physical    Patient: Katie Ellis DOB: 20-Sep-2000 DOA: 06/09/2022 DOS: the patient was seen and examined on 06/09/2022 PCP: Adaline Sill, NP  Patient coming from:  [facility]  Chief Complaint:  Chief Complaint  Patient presents with   Abdominal Pain   HPI: Katie Ellis is a 22 y.o. female with medical history significant of anxiety, depression, bipolar disorder and morbid obesity who presents from [facility] for urgent stenting by urology for left sided obstructive stone.   Patient had known kidney stone for the past month followed by primary doctor but has not been able to establish care with urology. She presented with worsening left-sided pain, nausea, vomiting and chills.   In ED, she was afebrile, tachycardic and normotensive. WBC of 31K, creatinine stable at 0.93. UA shows blood, negative nitrate, small leukocytes.   CT renal stone study revealed moderate to mark left sided perinephric stranding with 58mm left UPJ calculus leading to mild LEFT hydronephrosis and signs of moderate to marked renal edema.   EDP consulted on-call urologist Dr. Milford Cage who recommended direct transfer to PACU for urgent stent placement.  Hospitalist being called post-op to continue pain management and monitoring tachycardia.    Post-operatively pt has improved abdominal pain and less nausea. Has been able to tolerate oral intake. Continues to be tachycardic with HR up to 130.  Review of Systems: As mentioned in the history of present illness. All other systems reviewed and are negative. Past Medical History:  Diagnosis Date   Asthma    History reviewed. No pertinent surgical history. Social History:  reports that she is a non-smoker but has been exposed to tobacco smoke. She has never used smokeless tobacco. She reports current drug use. Drug: Marijuana. She reports that she does not drink alcohol.  No Known Allergies  Family History  Problem  Relation Age of Onset   Diabetes Mother    Hypertension Mother    Hyperlipidemia Mother    Cancer Mother        lung   COPD Father     Prior to Admission medications   Medication Sig Start Date End Date Taking? Authorizing Provider  amoxicillin-clavulanate (AUGMENTIN) 875-125 MG tablet Take 1 tablet by mouth 2 (two) times daily. 12/11/17  Yes Claretta Fraise, MD  HYDROcodone-acetaminophen (NORCO/VICODIN) 5-325 MG tablet Take 1 tablet by mouth every 6 (six) hours as needed for moderate pain.   Yes [provider]  pseudoephedrine-guaifenesin (MUCINEX D) 60-600 MG 12 hr tablet Take 1 tablet by mouth every 12 (twelve) hours. 12/11/17   Claretta Fraise, MD    Physical Exam: Vitals:   06/09/22 1930 06/09/22 1945 06/09/22 2039 06/09/22 2245  BP: 131/76 131/85 103/69 (!) 125/58  Pulse: (!) 127 (!) 123 (!) 132 (!) 145  Resp: (!) 22 20 18 18   Temp:  100 F (37.8 C) 97.7 F (36.5 C) 98.5 F (36.9 C)  TempSrc:   Oral Oral  SpO2: 98% 100% 100% 100%  Weight:      Height:       Constitutional: NAD, calm, comfortable. Morbid obesity young female laying in bed Eyes: lids and conjunctivae normal ENMT: Mucous membranes are moist.  Neck: normal, supple Respiratory: clear to auscultation bilaterally, no wheezing, no crackles. Normal respiratory effort. No accessory muscle use.  Cardiovascular: Regular rate and rhythm, no murmurs / rubs / gallops. No extremity edema.  Abdomen: soft, non-tender, non-distended  GU: foley catheter in place with pink colored urine  Musculoskeletal: no clubbing / cyanosis. No joint deformity upper and lower extremities. Good ROM, no contractures. Normal muscle tone.  Skin: no rashes, lesions, ulcers. No induration Neurologic: CN 2-12 grossly intact.   Psychiatric: Normal judgment and insight. Alert and oriented x 3. Normal mood. Data Reviewed:  See HPI  Assessment and Plan: * Sepsis ([place]) -secondary to obstructive uropathy as evidenced with tachycardia,  leukocytosis and CT renal stone study finding of mark left sided perinephric stranding with 29mm left UPJ calculus leading to mild LEFT hydronephrosis and signs of moderate to marked renal edema.  -creatinine stable -S/P urgent left JJ stent with urologist Dr. Milford Cage  -continue IV Rocephin pending urine culture -urology will continue to follow. She will eventually need  definitive management for her stone with either lithotripsy or retrograde ureteroscopy and laser lithotripsy.  Hyperglycemia -BG of  215 -Check HA1C  Bipolar disorder (HCC) -continue home Vraylar  Hepatic steatosis -severe hepatic steatosis and hepatomegaly noted on CT  -LFTs are within normal limits -encourage weight loss and follow up outpatient  Obstructive uropathy    Morbid obesity ([place]) BMI of greater than 46      Advance Care Planning: Full  Consults: urology  Family Communication: sister at bedside  Severity of Illness: The appropriate patient status for this patient is OBSERVATION. Observation status is judged to be reasonable and necessary in order to provide the required intensity of service to ensure the patient's safety. The patient's presenting symptoms, physical exam findings, and initial radiographic and laboratory data in the context of their medical condition is felt to place them at decreased risk for further clinical deterioration. Furthermore, it is anticipated that the patient will be medically stable for discharge from the hospital within 2 midnights of admission.   Author: Orene Desanctis, DO 06/09/2022 11:50 PM  For on call review [email or web address].

## 2022-06-09 NOTE — ED Notes (Signed)
Patient transported to CT 

## 2022-06-09 NOTE — Assessment & Plan Note (Addendum)
-   Continue Vraylar

## 2022-06-10 ENCOUNTER — Encounter (HOSPITAL_COMMUNITY): Payer: Self-pay | Admitting: Urology

## 2022-06-10 DIAGNOSIS — N1330 Unspecified hydronephrosis: Secondary | ICD-10-CM | POA: Diagnosis present

## 2022-06-10 DIAGNOSIS — D649 Anemia, unspecified: Secondary | ICD-10-CM | POA: Diagnosis present

## 2022-06-10 DIAGNOSIS — E860 Dehydration: Secondary | ICD-10-CM | POA: Diagnosis present

## 2022-06-10 DIAGNOSIS — Z83438 Family history of other disorder of lipoprotein metabolism and other lipidemia: Secondary | ICD-10-CM | POA: Diagnosis not present

## 2022-06-10 DIAGNOSIS — N179 Acute kidney failure, unspecified: Secondary | ICD-10-CM | POA: Diagnosis present

## 2022-06-10 DIAGNOSIS — N10 Acute tubulo-interstitial nephritis: Secondary | ICD-10-CM | POA: Diagnosis not present

## 2022-06-10 DIAGNOSIS — N139 Obstructive and reflux uropathy, unspecified: Secondary | ICD-10-CM | POA: Diagnosis present

## 2022-06-10 DIAGNOSIS — Z8249 Family history of ischemic heart disease and other diseases of the circulatory system: Secondary | ICD-10-CM | POA: Diagnosis not present

## 2022-06-10 DIAGNOSIS — N132 Hydronephrosis with renal and ureteral calculous obstruction: Secondary | ICD-10-CM

## 2022-06-10 DIAGNOSIS — F319 Bipolar disorder, unspecified: Secondary | ICD-10-CM | POA: Diagnosis present

## 2022-06-10 DIAGNOSIS — J45909 Unspecified asthma, uncomplicated: Secondary | ICD-10-CM | POA: Diagnosis present

## 2022-06-10 DIAGNOSIS — K760 Fatty (change of) liver, not elsewhere classified: Secondary | ICD-10-CM | POA: Diagnosis present

## 2022-06-10 DIAGNOSIS — N202 Calculus of kidney with calculus of ureter: Secondary | ICD-10-CM | POA: Diagnosis present

## 2022-06-10 DIAGNOSIS — E871 Hypo-osmolality and hyponatremia: Secondary | ICD-10-CM | POA: Diagnosis present

## 2022-06-10 DIAGNOSIS — Z801 Family history of malignant neoplasm of trachea, bronchus and lung: Secondary | ICD-10-CM | POA: Diagnosis not present

## 2022-06-10 DIAGNOSIS — Z6841 Body Mass Index (BMI) 40.0 and over, adult: Secondary | ICD-10-CM | POA: Diagnosis not present

## 2022-06-10 DIAGNOSIS — R7303 Prediabetes: Secondary | ICD-10-CM | POA: Diagnosis present

## 2022-06-10 DIAGNOSIS — Z833 Family history of diabetes mellitus: Secondary | ICD-10-CM | POA: Diagnosis not present

## 2022-06-10 DIAGNOSIS — Z825 Family history of asthma and other chronic lower respiratory diseases: Secondary | ICD-10-CM | POA: Diagnosis not present

## 2022-06-10 DIAGNOSIS — N136 Pyonephrosis: Secondary | ICD-10-CM | POA: Diagnosis present

## 2022-06-10 LAB — HIV ANTIBODY (ROUTINE TESTING W REFLEX): HIV Screen 4th Generation wRfx: NONREACTIVE

## 2022-06-10 LAB — CBC
HCT: 33.1 % — ABNORMAL LOW (ref 36.0–46.0)
Hemoglobin: 10.4 g/dL — ABNORMAL LOW (ref 12.0–15.0)
MCH: 28.7 pg (ref 26.0–34.0)
MCHC: 31.4 g/dL (ref 30.0–36.0)
MCV: 91.2 fL (ref 80.0–100.0)
Platelets: 192 10*3/uL (ref 150–400)
RBC: 3.63 MIL/uL — ABNORMAL LOW (ref 3.87–5.11)
RDW: 15.7 % — ABNORMAL HIGH (ref 11.5–15.5)
WBC: 30.8 10*3/uL — ABNORMAL HIGH (ref 4.0–10.5)
nRBC: 0 % (ref 0.0–0.2)

## 2022-06-10 LAB — BASIC METABOLIC PANEL
Anion gap: 11 (ref 5–15)
BUN: 10 mg/dL (ref 6–20)
CO2: 21 mmol/L — ABNORMAL LOW (ref 22–32)
Calcium: 8.2 mg/dL — ABNORMAL LOW (ref 8.9–10.3)
Chloride: 99 mmol/L (ref 98–111)
Creatinine, Ser: 1.46 mg/dL — ABNORMAL HIGH (ref 0.44–1.00)
GFR, Estimated: 51 mL/min — ABNORMAL LOW (ref >60)
Glucose, Bld: 223 mg/dL — ABNORMAL HIGH (ref 70–99)
Potassium: 3.6 mmol/L (ref 3.5–5.1)
Sodium: 131 mmol/L — ABNORMAL LOW (ref 135–145)

## 2022-06-10 MED ORDER — GLUCERNA SHAKE PO LIQD
237.0000 mL | Freq: Two times a day (BID) | ORAL | Status: DC
  Administered 2022-06-10 – 2022-06-11 (×2): 237 mL via ORAL
  Filled 2022-06-10 (×3): qty 237

## 2022-06-10 MED ORDER — ALUM & MAG HYDROXIDE-SIMETH 200-200-20 MG/5ML PO SUSP
30.0000 mL | ORAL | Status: DC | PRN
  Administered 2022-06-10 – 2022-06-11 (×2): 30 mL via ORAL
  Filled 2022-06-10 (×2): qty 30

## 2022-06-10 MED ORDER — ONDANSETRON HCL 4 MG PO TABS: 4.0000 mg | ORAL_TABLET | Freq: Three times a day (TID) | ORAL | Status: DC | PRN

## 2022-06-10 MED ORDER — OXYCODONE HCL 5 MG PO TABS
5.0000 mg | ORAL_TABLET | ORAL | Status: DC | PRN
  Administered 2022-06-10 – 2022-06-11 (×6): 5 mg via ORAL
  Filled 2022-06-10 (×6): qty 1

## 2022-06-10 MED ORDER — LACTATED RINGERS IV BOLUS
1000.0000 mL | Freq: Once | INTRAVENOUS | Status: AC
  Administered 2022-06-10: 1000 mL via INTRAVENOUS

## 2022-06-10 MED ORDER — ONDANSETRON HCL 4 MG/2ML IJ SOLN
4.0000 mg | Freq: Three times a day (TID) | INTRAMUSCULAR | Status: DC | PRN
  Administered 2022-06-10 – 2022-06-11 (×2): 4 mg via INTRAVENOUS
  Filled 2022-06-10 (×2): qty 2

## 2022-06-10 MED ORDER — ADULT MULTIVITAMIN W/MINERALS CH
1.0000 | ORAL_TABLET | Freq: Every day | ORAL | Status: DC
  Administered 2022-06-10 – 2022-06-12 (×3): 1 via ORAL
  Filled 2022-06-10 (×3): qty 1

## 2022-06-10 MED ORDER — NAPROXEN 250 MG PO TABS: 375.0000 mg | ORAL_TABLET | Freq: Three times a day (TID) | ORAL | Status: DC | PRN

## 2022-06-10 MED ORDER — POTASSIUM CHLORIDE 2 MEQ/ML IV SOLN
INTRAVENOUS | Status: DC
  Filled 2022-06-10 (×10): qty 1000

## 2022-06-10 MED ORDER — ACETAMINOPHEN 500 MG PO TABS
1000.0000 mg | ORAL_TABLET | Freq: Three times a day (TID) | ORAL | Status: DC
  Administered 2022-06-10 – 2022-06-12 (×6): 1000 mg via ORAL
  Filled 2022-06-10 (×6): qty 2

## 2022-06-10 NOTE — Progress Notes (Signed)
1 Day Post-Op Subjective: Patient status post cystoscopy insertion left JJ stent for left proximal stone on 06/09/2022.  Fever has come down overnight.  Feeling much better appetite improving no further nausea or vomiting.  Urine is relatively clear in the catheter this morning.  Objective: Vital signs in last 24 hours: Temp:  [97.6 F (36.4 C)-100 F (37.8 C)] 97.6 F (36.4 C) (03/22 1032) Pulse Rate:  [109-165] 109 (03/22 1032) Resp:  [16-28] 19 (03/22 1032) BP: (103-146)/(44-93) 121/74 (03/22 1032) SpO2:  [97 %-100 %] 100 % (03/22 1032) Weight:  [132 kg-134.7 kg] 134.7 kg (03/22 0500)  Intake/Output from previous day: 03/21 0701 - 03/22 0700 In: 1069 [P.O.:180; I.V.:729; IV Piggyback:160] Out: 625 [Urine:625] Intake/Output this shift: No intake/output data recorded.  Physical Exam:  General: Alert and oriented  Lab Results: Recent Labs    06/09/22 1307 06/10/22 0435  HGB 12.9 10.4*  HCT 40.4 33.1*   BMET Recent Labs    06/09/22 1307 06/10/22 0435  NA 133* 131*  K 3.8 3.6  CL 95* 99  CO2 25 21*  GLUCOSE 215* 223*  BUN 5* 10  CREATININE 0.93 1.46*  CALCIUM 9.6 8.2*     Studies/Results: DG C-Arm 1-60 Min-No Report  Result Date: 06/09/2022 Fluoroscopy was utilized by the requesting physician.  No radiographic interpretation.   CT Renal Stone Study  Result Date: 06/09/2022 CLINICAL DATA:  22 year old female presents for evaluation of abdominal pain and flank pain with suspected kidney stone. EXAM: CT ABDOMEN AND PELVIS WITHOUT CONTRAST TECHNIQUE: Multidetector CT imaging of the abdomen and pelvis was performed following the standard protocol without IV contrast. RADIATION DOSE REDUCTION: This exam was performed according to the departmental dose-optimization program which includes automated exposure control, adjustment of the mA and/or kV according to patient size and/or use of iterative reconstruction technique. COMPARISON:  None available. FINDINGS: Lower  chest: Incidental imaging of the lung bases without effusion or sign of consolidative change. No chest wall abnormality. Hepatobiliary: Severe hepatic steatosis with fatty sparing about the gallbladder fossa. Signs of hepatomegaly liver measuring approximately 20 cm greatest craniocaudal dimension. Fissural widening of hepatic fissures. No pericholecystic stranding or gross biliary duct dilation. Pancreas: Normal, without mass, inflammation or ductal dilatation. Spleen: Normal. Adrenals/Urinary Tract: Adrenal glands are normal. Moderate to marked LEFT-sided perinephric stranding with 5 mm LEFT UPJ calculus leading to mild LEFT hydronephrosis and signs of renal edema. No additional urinary tract calculi on either the RIGHT or LEFT. Smooth renal contours. Urinary bladder under distended. Mild perivesical stranding. Stomach/Bowel: Stomach under distended. No sign of small bowel obstruction or acute small bowel process. Appendix is normal. Colon without signs of obstruction or inflammation. Vascular/Lymphatic: Aorta with smooth contours. IVC with smooth contours. No aneurysmal dilation of the abdominal aorta. There is no gastrohepatic or hepatoduodenal ligament lymphadenopathy. No retroperitoneal or mesenteric lymphadenopathy. No pelvic sidewall lymphadenopathy. Limited assessment of vascular structures due to lack of intravenous contrast. Reproductive: Unremarkable by CT. Other: No ascites.  No pneumoperitoneum. Musculoskeletal: No acute or significant osseous findings. IMPRESSION: 1. Moderate to marked LEFT-sided perinephric stranding with 5 mm LEFT UPJ calculus leading to mild LEFT hydronephrosis and signs of moderate to marked renal edema. Signs of obstructive uropathy which may be worse than the degree of hydronephrosis suggest currently. Given the degree of stranding and edema about the kidney and stranding about the urinary bladder would also suggest correlation with any signs of urinary tract infection. 2.  Severe hepatic steatosis and hepatomegaly with fissural widening of hepatic  fissures. Findings could reflect early liver disease. Correlate with signs of nonalcoholic steatohepatitis and with any current evidence of liver disease or metabolic syndrome. Electronically Signed   By: Zetta Bills M.D.   On: 06/09/2022 14:54    Assessment/Plan: 1.  Left proximal ureteral stone with sepsis now status post insertion of left JJ stent, improving Plan/recommendation.  Await urine culture results.  Continue antibiotics.  Will DC Foley.  Spoke to her about treatment options.  Stone appeared to be faintly visualized under fluoroscopy so she probably will be a candidate for lithotripsy.  Recommend we get her back to the office in approximately 1 week as an outpatient for KUB to assess visibility of the stone.    LOS: 0 days   Remi Haggard 06/10/2022, 12:36 PM

## 2022-06-10 NOTE — Assessment & Plan Note (Addendum)
Still quite tachycardic >100-110, urine culture pending, WBC >20K - Continue Rocephin - Continue IV fluids

## 2022-06-10 NOTE — Progress Notes (Signed)
   06/09/22 2039  Assess: MEWS Score  Temp 97.7 F (36.5 C)  BP 103/69  MAP (mmHg) 81  Pulse Rate (!) 132  Resp 18  SpO2 100 %  Assess: MEWS Score  MEWS Temp 0  MEWS Systolic 0  MEWS Pulse 3  MEWS RR 0  MEWS LOC 0  MEWS Score 3  MEWS Score Color Yellow  Assess: if the MEWS score is Yellow or Red  Were vital signs taken at a resting state? Yes  Focused Assessment No change from prior assessment  Does the patient meet 2 or more of the SIRS criteria? No  MEWS guidelines implemented  Yes, yellow  Treat  MEWS Interventions Considered administering scheduled or prn medications/treatments as ordered  Take Vital Signs  Increase Vital Sign Frequency  Yellow: Q2hr x1, continue Q4hrs until patient remains green for 12hrs  Escalate  MEWS: Escalate Yellow: Discuss with charge nurse and consider notifying provider and/or RRT  Notify: Charge Nurse/RN  Name of Charge Nurse/RN Notified Pam, RN  Assess: SIRS CRITERIA  SIRS Temperature  0  SIRS Pulse 1  SIRS Respirations  0  SIRS WBC 0  SIRS Score Sum  1

## 2022-06-10 NOTE — Assessment & Plan Note (Addendum)
Cr 1.4 on admission, improved to 1.1 today, baseilne is 0.9, due to dehydration. - Continue IV fluids - Trend BMP

## 2022-06-10 NOTE — Progress Notes (Signed)
Initial Nutrition Assessment  DOCUMENTATION CODES:   Morbid obesity  INTERVENTION:  Glucerna Shake po BID, each supplement provides 220 kcal and 10 grams of protein Continue regular diet  Education about bowel patterns  MVI    NUTRITION DIAGNOSIS:   Inadequate oral intake related to inability to eat, nausea, other (see comment), vomiting (abdominal pain) as evidenced by per patient/family report, mild muscle depletion, energy intake < or equal to 50% for > or equal to 5 days.   GOAL:   Patient will meet greater than or equal to 90% of their needs   MONITOR:   PO intake, Supplement acceptance, Weight trends  REASON FOR ASSESSMENT:   Malnutrition Screening Tool    ASSESSMENT:   22 y.o. female with PMHx of anxiety, depression, bipolar disorder and morbid obesity [place] for urgent stenting by urology for left sided obstructive stone. Patient dx'd sepsis   Labs: Na 131, Glu 223, Cr 1.46,  Meds: lactated ringers  Wt: no recent wt hx  PO: no po documented at this time  I/O's: +444 mL   Visited patient at bedside who is S/P urgent left JJ stent with urosepsis. She reports minimal po intake over the past several weeks due to kidney stones. She reports a 28# wt loss over that timeframe ( RD unable to confirm due to limited wt hx). Patient states postprandial N/V prior to procedure due to pain.   Patient states she is a Engineer, petroleum, skips breakfast (dislikes breakfast food), and has a pretty big lunch and dinner each day. She states today was the first day she was able to tolerate a meal and she plans to be eating food from outside the hospital for dinner.   Patient reports muscle weakness. She denies chew/swallowing issues. Patient denies N/V/D/C and reports her BM's are regular and zofran is controlling her nausea whenever she has it.   NUTRITION - FOCUSED PHYSICAL EXAM:  Flowsheet Row Most Recent Value  Orbital Region No depletion  Upper Arm Region  No depletion  Thoracic and Lumbar Region No depletion  Buccal Region No depletion  Temple Region Mild depletion  Clavicle Bone Region No depletion  Clavicle and Acromion Bone Region No depletion  Scapular Bone Region No depletion  Dorsal Hand No depletion  Patellar Region No depletion  Anterior Thigh Region No depletion  Posterior Calf Region Mild depletion  Edema (RD Assessment) None  Hair Reviewed  Eyes Reviewed  Mouth Reviewed  Skin Reviewed  Nails Reviewed       Diet Order:   Diet Order             Diet regular Room service appropriate? Yes; Fluid consistency: Thin  Diet effective now                   EDUCATION NEEDS:   Education needs have been addressed  Skin:  Skin Assessment: Reviewed RN Assessment  Last BM:  3/21  Height:   Ht Readings from Last 1 Encounters:  06/10/22 5\' 6"  (1.676 m)    Weight:   Wt Readings from Last 1 Encounters:  06/10/22 134.7 kg    Ideal Body Weight:     BMI:  Body mass index is 47.93 kg/m.  Estimated Nutritional Needs:   Kcal:  1700-2000  Protein:  70-90  Fluid:  >/= 2L    Trey Paula, RDN, LDN  Clinical Nutrition

## 2022-06-10 NOTE — Assessment & Plan Note (Signed)
Resolved with fluids °

## 2022-06-10 NOTE — Progress Notes (Signed)
  Progress Note   Patient: Katie Ellis [id number] DOB: 10/20/2000 DOA: 06/09/2022     0 DOS: the patient was seen and examined on 06/10/2022 at 10:55AM      Brief hospital course: Ms Mormando is a 22 y.o. F with morbid obesity, asthma who presented with left sided obstructing stone and pyelonephritis.     Assessment and Plan: * Hydronephrosis    Acute pyelonephritis Still tachycardic >120, urine culture not collected. - Continue Roecphin - COntinue IV fluids - Remove foley   Normocytic anemia    AKI (acute kidney injury) ([place]) Baseline Cr 0.9, today up to 1.4 in setting of hypotension, tachycardia, dehdyration. - Continue IV fluids - Trend BMP  Hyponatremia Asymptomtic - Continue IV fluids  Hyperglycemia - Check HA1C  Bipolar disorder (HCC) - Continue Vraylar  Hepatic steatosis    Morbid obesity (HCC) BMI of greater than 46          Subjective: Some pain on left side.  No fever, no confusion, no hematuria.  Foley out today.     Physical Exam: BP 135/82 (BP Location: Left Leg)   Pulse (!) 104   Temp (!) 97.5 F (36.4 C) (Oral)   Resp 18   Ht 5\' 6"  (1.676 m)   Wt 134.7 kg   LMP 05/11/2022 Comment: Preg. Negative  SpO2 99%   BMI 47.93 kg/m   Adult female, lying in bed, interactive and appropriate Tachycardic, regular, no murmurs, no peripheral edema Respiratory normal, lungs clear without rales or wheezes Abdomen soft with mild tenderness at the left, but no rigidity or guarding, no ascites or distention  Data Reviewed: Discussed with Dr. Milford Cage by secure chat Basic metabolic panel shows sodium 131, unchanged, creatinine up to 1.4 CBC shows white blood cell count of 30, unchanged  Family Communication: Sister at the bedside    Disposition: Status is: Inpatient Requires ongoing IV fluids and IV antibiotics        Author: Edwin Dada, MD 06/10/2022 3:56 PM  For on call review [email or web address].

## 2022-06-10 NOTE — Progress Notes (Signed)
  Transition of Care [facility]) Screening Note   Patient Details  Name: Katie Ellis Date of Birth: 06-May-2000   Transition of Care [facility]) CM/SW Contact:    Henrietta Dine, RN Phone Number: 06/10/2022, 9:13 AM    Transition of Care Department [facility]) has reviewed patient and no TOC needs have been identified at this time. We will continue to monitor patient advancement through interdisciplinary progression rounds. If new patient transition needs arise, please place a TOC consult.

## 2022-06-11 DIAGNOSIS — N10 Acute tubulo-interstitial nephritis: Secondary | ICD-10-CM | POA: Diagnosis not present

## 2022-06-11 DIAGNOSIS — N179 Acute kidney failure, unspecified: Secondary | ICD-10-CM | POA: Diagnosis not present

## 2022-06-11 DIAGNOSIS — N132 Hydronephrosis with renal and ureteral calculous obstruction: Secondary | ICD-10-CM | POA: Diagnosis not present

## 2022-06-11 DIAGNOSIS — F319 Bipolar disorder, unspecified: Secondary | ICD-10-CM | POA: Diagnosis not present

## 2022-06-11 LAB — BASIC METABOLIC PANEL
Anion gap: 10 (ref 5–15)
BUN: 14 mg/dL (ref 6–20)
CO2: 26 mmol/L (ref 22–32)
Calcium: 8.4 mg/dL — ABNORMAL LOW (ref 8.9–10.3)
Chloride: 100 mmol/L (ref 98–111)
Creatinine, Ser: 1.12 mg/dL — ABNORMAL HIGH (ref 0.44–1.00)
GFR, Estimated: >60 mL/min (ref >60)
Glucose, Bld: 151 mg/dL — ABNORMAL HIGH (ref 70–99)
Potassium: 3.5 mmol/L (ref 3.5–5.1)
Sodium: 136 mmol/L (ref 135–145)

## 2022-06-11 LAB — CBC
HCT: 35.9 % — ABNORMAL LOW (ref 36.0–46.0)
Hemoglobin: 11.1 g/dL — ABNORMAL LOW (ref 12.0–15.0)
MCH: 28.4 pg (ref 26.0–34.0)
MCHC: 30.9 g/dL (ref 30.0–36.0)
MCV: 91.8 fL (ref 80.0–100.0)
Platelets: 209 10*3/uL (ref 150–400)
RBC: 3.91 MIL/uL (ref 3.87–5.11)
RDW: 15.8 % — ABNORMAL HIGH (ref 11.5–15.5)
WBC: 26.9 10*3/uL — ABNORMAL HIGH (ref 4.0–10.5)
nRBC: 0.1 % (ref 0.0–0.2)

## 2022-06-11 LAB — URINE CULTURE: Culture: NO GROWTH

## 2022-06-11 LAB — HEMOGLOBIN A1C
Hgb A1c MFr Bld: 6.4 % — ABNORMAL HIGH (ref 4.8–5.6)
Mean Plasma Glucose: 137 mg/dL

## 2022-06-11 MED ORDER — OXYCODONE HCL 5 MG PO TABS
5.0000 mg | ORAL_TABLET | ORAL | Status: DC | PRN
  Administered 2022-06-11 – 2022-06-12 (×6): 7.5 mg via ORAL
  Filled 2022-06-11 (×6): qty 2

## 2022-06-11 MED ORDER — SODIUM CHLORIDE 0.9% FLUSH: 10.0000 mL | INTRAVENOUS | Status: DC | PRN

## 2022-06-11 MED ORDER — OXYCODONE HCL 5 MG PO TABS
2.5000 mg | ORAL_TABLET | Freq: Once | ORAL | Status: AC
  Administered 2022-06-11: 2.5 mg via ORAL
  Filled 2022-06-11: qty 1

## 2022-06-11 NOTE — Progress Notes (Signed)
MEWS Progress Note  Patient Details Name: Katie Ellis MRN: [id number] DOB: October 06, 2000 Today's Date: 06/11/2022   MEWS Flowsheet Documentation:  Assess: MEWS Score Temp: 97.9 F (36.6 C) BP: (!) 148/86 MAP (mmHg): 103 Pulse Rate: (!) 117 ECG Heart Rate: (!) 123 Resp: 20 Level of Consciousness: Alert SpO2: 93 % O2 Device: Room Air Patient Activity (if Appropriate): In bed O2 Flow Rate (L/min): 2 L/min Assess: MEWS Score MEWS Temp: 0 MEWS Systolic: 0 MEWS Pulse: 2 MEWS RR: 0 MEWS LOC: 0 MEWS Score: 2 MEWS Score Color: Yellow Assess: SIRS CRITERIA SIRS Temperature : 0 SIRS Respirations : 0 SIRS Pulse: 1 SIRS WBC: 0 SIRS Score Sum : 1 Assess: if the MEWS score is Yellow or Red Were vital signs taken at a resting state?: Yes Focused Assessment: No change from prior assessment Does the patient meet 2 or more of the SIRS criteria?: No MEWS guidelines implemented : No, previously yellow, continue vital signs every 4 hours Treat MEWS Interventions: Considered administering scheduled or prn medications/treatments as ordered Take Vital Signs Increase Vital Sign Frequency : Yellow: Q2hr x1, continue Q4hrs until patient remains green for 12hrs Escalate MEWS: Escalate: Yellow: Discuss with charge nurse and consider notifying provider and/or RRT Notify: Charge Nurse/RN Name of Charge Nurse/RN Notified: Minette Headland, RN      Vicki Mallet 06/11/2022, 6:32 AM

## 2022-06-11 NOTE — Progress Notes (Signed)
  Progress Note   Patient: Katie Ellis [id number] DOB: 11/05/2000 DOA: 06/09/2022     1 DOS: the patient was seen and examined on 06/11/2022 at 9:52AM      Brief hospital course: Ms Schwinghammer is a 22 y.o. F with morbid obesity, asthma who presented with left sided obstructing stone and pyelonephritis.     Assessment and Plan: * Hydronephrosis     Acute pyelonephritis Still quite tachycardic >100-110, urine culture pending, WBC >20K - Continue Rocephin - Continue IV fluids        AKI (acute kidney injury) ([place]) Cr 1.4 on admission, improved to 1.1 today, baseilne is 0.9, due to dehydration. - Continue IV fluids - Trend BMP  Hyponatremia Resolved with fluids  Hyperglycemia Prediabetes, A1c 6.4%  Bipolar disorder (HCC) - Continue Vraylar    Morbid obesity (HCC) BMI of greater than 46          Subjective: Pain poorly controlled voernight, no other nursing concersn.  No fever.  Still with a lot of soreness on the right side.  No vomiting.  Still tachycardic.     Physical Exam: BP (!) 179/115 (BP Location: Left Leg)   Pulse (!) 109   Temp 98.6 F (37 C) (Oral)   Resp 20   Ht 5\' 6"  (1.676 m)   Wt 134.7 kg   LMP 05/11/2022 Comment: Preg. Negative  SpO2 95%   BMI 47.93 kg/m   Adult famele, lying in bed, appears uncomfortable Tachycardic, regular, no mumrus, no LE edema RR seems increased, lung sonds diinisehd. Abdomen, tender on right, normal on left, no rebound, no ascites Mentation normal, movesall extremities normally    Data Reviewed: BMP shows improving creatinine CBC shows persistent leukocytosis CUlture still pending  Family Communication: Aunt at bedside    Disposition: Status is: Inpatient Still requires IV antibiotics        Author: Edwin Dada, MD 06/11/2022 10:02 AM  For on call review [email or web address].

## 2022-06-11 NOTE — Progress Notes (Signed)
   06/11/22 2031  Assess: MEWS Score  Temp 99.4 F (37.4 C)  BP (!) 152/92  MAP (mmHg) 111  Pulse Rate (!) 119  Resp 19  Level of Consciousness Alert  SpO2 97 %  O2 Device Room Air  Assess: MEWS Score  MEWS Temp 0  MEWS Systolic 0  MEWS Pulse 2  MEWS RR 0  MEWS LOC 0  MEWS Score 2  MEWS Score Color Yellow  Assess: if the MEWS score is Yellow or Red  Were vital signs taken at a resting state? Yes  Focused Assessment No change from prior assessment  Does the patient meet 2 or more of the SIRS criteria? No  MEWS guidelines implemented  No, previously yellow, continue vital signs every 4 hours  Notify: Charge Nurse/RN  Name of Charge Nurse/RN Art gallery manager, RN  Assess: SIRS CRITERIA  SIRS Temperature  0  SIRS Pulse 1  SIRS Respirations  0  SIRS WBC 0  SIRS Score Sum  1

## 2022-06-12 DIAGNOSIS — N10 Acute tubulo-interstitial nephritis: Secondary | ICD-10-CM | POA: Diagnosis not present

## 2022-06-12 DIAGNOSIS — N179 Acute kidney failure, unspecified: Secondary | ICD-10-CM | POA: Diagnosis not present

## 2022-06-12 DIAGNOSIS — N132 Hydronephrosis with renal and ureteral calculous obstruction: Secondary | ICD-10-CM | POA: Diagnosis not present

## 2022-06-12 DIAGNOSIS — K760 Fatty (change of) liver, not elsewhere classified: Secondary | ICD-10-CM | POA: Diagnosis not present

## 2022-06-12 LAB — COMPREHENSIVE METABOLIC PANEL
ALT: 20 U/L (ref 0–44)
AST: 36 U/L (ref 15–41)
Albumin: 2.3 g/dL — ABNORMAL LOW (ref 3.5–5.0)
Alkaline Phosphatase: 68 U/L (ref 38–126)
Anion gap: 8 (ref 5–15)
BUN: 10 mg/dL (ref 6–20)
CO2: 25 mmol/L (ref 22–32)
Calcium: 8.1 mg/dL — ABNORMAL LOW (ref 8.9–10.3)
Chloride: 101 mmol/L (ref 98–111)
Creatinine, Ser: 0.97 mg/dL (ref 0.44–1.00)
GFR, Estimated: >60 mL/min (ref >60)
Glucose, Bld: 147 mg/dL — ABNORMAL HIGH (ref 70–99)
Potassium: 4.4 mmol/L (ref 3.5–5.1)
Sodium: 134 mmol/L — ABNORMAL LOW (ref 135–145)
Total Bilirubin: 0.3 mg/dL (ref 0.3–1.2)
Total Protein: 5.8 g/dL — ABNORMAL LOW (ref 6.5–8.1)

## 2022-06-12 LAB — CBC
HCT: 33.1 % — ABNORMAL LOW (ref 36.0–46.0)
Hemoglobin: 10 g/dL — ABNORMAL LOW (ref 12.0–15.0)
MCH: 27.2 pg (ref 26.0–34.0)
MCHC: 30.2 g/dL (ref 30.0–36.0)
MCV: 90.2 fL (ref 80.0–100.0)
Platelets: 179 10*3/uL (ref 150–400)
RBC: 3.67 MIL/uL — ABNORMAL LOW (ref 3.87–5.11)
RDW: 16 % — ABNORMAL HIGH (ref 11.5–15.5)
WBC: 11.8 10*3/uL — ABNORMAL HIGH (ref 4.0–10.5)
nRBC: 0.2 % (ref 0.0–0.2)

## 2022-06-12 MED ORDER — OXYCODONE HCL 5 MG PO TABS: 5.0000 mg | ORAL_TABLET | ORAL | 0 refills | Status: DC | PRN

## 2022-06-12 MED ORDER — ACETAMINOPHEN 500 MG PO TABS: 1000.0000 mg | ORAL_TABLET | Freq: Three times a day (TID) | ORAL | 0 refills | Status: AC

## 2022-06-12 MED ORDER — CEFADROXIL 500 MG PO CAPS: 1000.0000 mg | ORAL_CAPSULE | Freq: Two times a day (BID) | ORAL | 0 refills | Status: AC

## 2022-06-12 MED ORDER — ONDANSETRON HCL 4 MG PO TABS: 4.0000 mg | ORAL_TABLET | Freq: Three times a day (TID) | ORAL | 0 refills | Status: AC | PRN

## 2022-06-12 NOTE — Progress Notes (Signed)
Patient discharged: Home with family  Via: Wheelchair   Discharge paperwork given: to patient and family  Reviewed with teach back  IV  disconnected and removed  Belongings given to patient

## 2022-06-12 NOTE — Discharge Summary (Signed)
Physician Discharge Summary   Patient: Katie Ellis MRN: [id number] DOB: 2000/10/28  Admit date:     06/09/2022  Discharge date: 06/12/22  Discharge Physician: Edwin Dada   PCP: Adaline Sill, NP     Recommendations at discharge:  Follow up with Urology Dr. Milford Cage for infected nephrolithiasis with obstruction -- stent and stone removal Follow up with PCP Dr. Georgina Quint in 1 week for pre-diabetes Dr. Georgina Quint: Follow up A1c 6.4% here Repeat Hgb in 4-6 weeks     Discharge Diagnoses: Principal Problem:   Hydronephrosis Active Problems:   Acute pyelonephritis   Morbid obesity, BMI 47.9   Hepatic steatosis   Bipolar disorder (HCC)   Hyperglycemia   Hyponatremia   AKI (acute kidney injury) ([place])   Normocytic anemia      Hospital Course: [name] is a 22 y.o. F with morbid obesity, asthma who presented with left sided obstructing stone and pyelonephritis.     * Hydronephrosis and acute kidney injury with acute pyelonephritis due to nephrolith Admitted and taken to OR for LEFT stent.  Urine cultures taken after 2 doses antibiotics no growth.  WBC resolved, HR improved, creatinine resolved to baseline.  Taking orals well, pain well controlled.  Discharged with 10 days cefadroxil and follow up with Urology for stent removal and stone extraction      Normocytic anemia Mild    AKI (acute kidney injury) ([place]) Cr 1.4 on admission, improved to baseline 0.9 with fluids  Hyperglycemia Prediabetes, A1c 6.4%, hyperglycemic here.  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   Consultants: Urology Procedures performed:  Cystoscopy and JJ stent insertion on left   Disposition: Home   DISCHARGE MEDICATION: Allergies as of 06/12/2022   No Known Allergies      Medication List     STOP taking these medications    HYDROcodone-acetaminophen  5-325 MG tablet Commonly known as: NORCO/VICODIN   ondansetron 4 MG disintegrating tablet Commonly known as: ZOFRAN-ODT       TAKE these medications    acetaminophen 500 MG tablet Commonly known as: TYLENOL Take 2 tablets (1,000 mg total) by mouth every 8 (eight) hours.   amphetamine-dextroamphetamine 20 MG tablet Commonly known as: ADDERALL Take 20 mg by mouth 2 (two) times daily.   cefadroxil 500 MG capsule Commonly known as: DURICEF Take 2 capsules (1,000 mg total) by mouth 2 (two) times daily for 10 days.   Norgestimate-Ethinyl Estradiol Triphasic 0.18/0.215/0.25 MG-35 MCG tablet Take 1 tablet by mouth daily.   ondansetron 4 MG tablet Commonly known as: ZOFRAN Take 1 tablet (4 mg total) by mouth every 8 (eight) hours as needed for nausea or vomiting.   oxyCODONE 5 MG immediate release tablet Commonly known as: Oxy IR/ROXICODONE Take 1-1.5 tablets (5-7.5 mg total) by mouth every 4 (four) hours as needed for breakthrough pain.   Vraylar 1.5 MG capsule Generic drug: cariprazine Take 1.5 mg by mouth daily.        Follow-up Information     Adaline Sill, NP. Schedule an appointment as soon as possible for a visit in 1 week(s).   Specialty: Internal Medicine Contact information: [address] [place] [address] [phone]         Remi Haggard, MD Follow up.   Specialty: Urology Contact information: [address] [employer]  [place] [address] [phone]                 Discharge Instructions     Discharge instructions   Complete by: As directed    **IMPORTANT DISCHARGE INSTRUCTIONS**   From Dr. Loleta Books: You were admitted with a kidney stone blocking the ureter and a kidney infection  You were evaluated by Urology who placed a LEFT ureter stent to drain the kidney  You were treated with antibiotics  You should take the antibiotic cefadroxil 1000 mg (two tabs at a time) twice daily for the next 10 days  Dr. Milford Cage from  [facility] the [facility] tomorrow (Monday) and arrange for a follow up appointment Take it easy for the next week  Go see your primary if you are having issues.  For pain, take acetaminophen/Tylenol 1000 mg (two tabs) three times daily for the next week  If pain is severe, take oxycodone 5 to 7.5 (1 to 1 and 1/2 tabs) as needed  If you have nausea, you may take ondansetron/Zofran for nausea  You may actually take ibuprofen in addition to Tylenol or oxycodone (it works differently) if needed   Increase activity slowly   Complete by: As directed        Discharge Exam: Filed Weights   06/09/22 1300 06/09/22 1700 06/10/22 0500  Weight: 132 kg 132 kg 134.7 kg  BP (!) 144/87 (BP Location: Left Leg)   Pulse (!) 102   Temp 98.6 F (37 C) (Oral)   Resp 20   Ht 5\' 6"  (1.676 m)   Wt 134.7 kg   LMP 05/11/2022 Comment: Preg. Negative  SpO2 90%   BMI 47.93 kg/m    General: Pt is alert, awake, not in acute distress Cardiovascular: Tachycardic, regular, nl S1-S2, no murmurs appreciated.   No LE edema.   Respiratory: Normal respiratory rate and rhythm.  CTAB without rales or wheezes. Abdominal: Abdomen soft and non-tender.  No distension or HSM.   Neuro/Psych: Strength symmetric in upper and lower extremities.  Judgment and insight appear normal.   Condition at discharge: good  The results of significant diagnostics from this hospitalization (including imaging, microbiology, ancillary and laboratory) are listed below for reference.   Imaging Studies: DG C-Arm 1-60 Min-No Report  Result Date: 06/09/2022 Fluoroscopy was utilized by the requesting physician.  No radiographic interpretation.   CT Renal Stone Study  Result Date: 06/09/2022 CLINICAL DATA:  22 year old female presents for evaluation of abdominal pain and flank pain with suspected kidney stone. EXAM: CT ABDOMEN AND PELVIS WITHOUT CONTRAST TECHNIQUE: Multidetector CT imaging of the abdomen and pelvis  was performed following the standard protocol without IV contrast. RADIATION DOSE REDUCTION: This exam was performed according to the departmental dose-optimization program which includes automated exposure control, adjustment of the mA and/or kV according to patient size and/or use of iterative reconstruction technique. COMPARISON:  None available. FINDINGS: Lower chest: Incidental imaging of the lung bases without effusion or sign of consolidative change. No chest wall abnormality. Hepatobiliary: Severe hepatic steatosis with fatty sparing about the gallbladder fossa. Signs of hepatomegaly liver measuring approximately 20 cm greatest craniocaudal dimension. Fissural widening of hepatic fissures. No pericholecystic stranding or gross biliary duct dilation. Pancreas: Normal, without mass, inflammation or ductal dilatation. Spleen: Normal. Adrenals/Urinary Tract: Adrenal glands are normal. Moderate to marked LEFT-sided perinephric stranding with 5 mm LEFT UPJ calculus leading to mild LEFT hydronephrosis and signs of renal edema. No additional urinary tract calculi on either the RIGHT  or LEFT. Smooth renal contours. Urinary bladder under distended. Mild perivesical stranding. Stomach/Bowel: Stomach under distended. No sign of small bowel obstruction or acute small bowel process. Appendix is normal. Colon without signs of obstruction or inflammation. Vascular/Lymphatic: Aorta with smooth contours. IVC with smooth contours. No aneurysmal dilation of the abdominal aorta. There is no gastrohepatic or hepatoduodenal ligament lymphadenopathy. No retroperitoneal or mesenteric lymphadenopathy. No pelvic sidewall lymphadenopathy. Limited assessment of vascular structures due to lack of intravenous contrast. Reproductive: Unremarkable by CT. Other: No ascites.  No pneumoperitoneum. Musculoskeletal: No acute or significant osseous findings. IMPRESSION: 1. Moderate to marked LEFT-sided perinephric stranding with 5 mm LEFT UPJ  calculus leading to mild LEFT hydronephrosis and signs of moderate to marked renal edema. Signs of obstructive uropathy which may be worse than the degree of hydronephrosis suggest currently. Given the degree of stranding and edema about the kidney and stranding about the urinary bladder would also suggest correlation with any signs of urinary tract infection. 2. Severe hepatic steatosis and hepatomegaly with fissural widening of hepatic fissures. Findings could reflect early liver disease. Correlate with signs of nonalcoholic steatohepatitis and with any current evidence of liver disease or metabolic syndrome. Electronically Signed   By: Zetta Bills M.D.   On: 06/09/2022 14:54    Microbiology: Results for orders placed or performed during the hospital encounter of 06/09/22  Urine Culture (for pregnant, neutropenic or urologic patients or patients with an indwelling urinary catheter)     Status: None   Collection Time: 06/10/22 10:52 AM   Specimen: Urine, Catheterized  Result Value Ref Range Status   Specimen Description   Final    URINE, CATHETERIZED Performed at [facility], [place] [address], [place], [place] [address]    Special Requests   Final    NONE Performed at [facility] [place] [address], [place], [place] [address]    Culture   Final    NO GROWTH Performed at [facility], [place] [address], [place],  [address]    Report Status 06/11/2022 FINAL  Final    Labs: CBC: Recent Labs  Lab 06/09/22 1307 06/10/22 0435 06/11/22 0401 06/12/22 0236  WBC 31.5* 30.8* 26.9* 11.8*  HGB 12.9 10.4* 11.1* 10.0*  HCT 40.4 33.1* 35.9* 33.1*  MCV 88.0 91.2 91.8 90.2  PLT 389 192 209 0000000   Basic Metabolic Panel: Recent Labs  Lab 06/09/22 1307 06/10/22 0435 06/11/22 0401 06/12/22 0236  NA 133* 131* 136 134*  K 3.8 3.6 3.5 4.4  CL 95* 99 100 101  CO2 25 21* 26 25  GLUCOSE 215* 223* 151* 147*  BUN 5* 10 14 10   CREATININE 0.93 1.46*  1.12* 0.97  CALCIUM 9.6 8.2* 8.4* 8.1*   Liver Function Tests: Recent Labs  Lab 06/09/22 1307 06/12/22 0236  AST 13* 36  ALT 14 20  ALKPHOS 64 68  BILITOT 0.9 0.3  PROT 7.4 5.8*  ALBUMIN 4.0 2.3*   CBG: No results for input(s): "GLUCAP" in the last 168 hours.  Discharge time spent: approximately 35 minutes spent on discharge counseling, evaluation of patient on day of discharge, and coordination of discharge planning with nursing, social work, pharmacy and case management  Signed: Edwin Dada, MD [facility] 06/12/2022

## 2022-06-12 NOTE — Progress Notes (Addendum)
Pt's O2 sat 90% on room air. Pt denied difficult breathing. 2L oxygen applied through nasal cannula and rise HOB.

## 2022-06-24 ENCOUNTER — Other Ambulatory Visit: Payer: Self-pay | Admitting: Urology

## 2022-06-29 ENCOUNTER — Encounter (HOSPITAL_BASED_OUTPATIENT_CLINIC_OR_DEPARTMENT_OTHER): Payer: Self-pay | Admitting: Urology

## 2022-06-29 NOTE — Progress Notes (Signed)
Patient returned phone call for lithotripsy instructions. Patient states she has not received her blue folder in the mail yet.  Medications and medical history reviewed. Followed routine instruction sheet in litho instruction binder.  Father to be driver day of surgery.

## 2022-06-29 NOTE — Progress Notes (Signed)
Left voice mail for pt to return call for instructions 

## 2022-07-01 ENCOUNTER — Encounter (HOSPITAL_BASED_OUTPATIENT_CLINIC_OR_DEPARTMENT_OTHER)
Admission: RE | Disposition: A | Discharge status: Home / Self Care | Payer: Self-pay | Source: Home / Self Care | Attending: Urology

## 2022-07-01 ENCOUNTER — Other Ambulatory Visit: Payer: Self-pay

## 2022-07-01 ENCOUNTER — Encounter (HOSPITAL_BASED_OUTPATIENT_CLINIC_OR_DEPARTMENT_OTHER): Payer: Self-pay | Admitting: Urology

## 2022-07-01 ENCOUNTER — Ambulatory Visit (HOSPITAL_BASED_OUTPATIENT_CLINIC_OR_DEPARTMENT_OTHER)
Admission: RE | Admit: 2022-07-01 | Discharge: 2022-07-01 | Disposition: A | Discharge status: Home / Self Care | Payer: Medicaid Other | Attending: Urology | Admitting: Urology

## 2022-07-01 ENCOUNTER — Ambulatory Visit (HOSPITAL_COMMUNITY): Payer: Medicaid Other

## 2022-07-01 DIAGNOSIS — N132 Hydronephrosis with renal and ureteral calculous obstruction: Secondary | ICD-10-CM | POA: Insufficient documentation

## 2022-07-01 DIAGNOSIS — J45909 Unspecified asthma, uncomplicated: Secondary | ICD-10-CM | POA: Diagnosis not present

## 2022-07-01 DIAGNOSIS — E669 Obesity, unspecified: Secondary | ICD-10-CM | POA: Diagnosis not present

## 2022-07-01 DIAGNOSIS — Z6841 Body Mass Index (BMI) 40.0 and over, adult: Secondary | ICD-10-CM | POA: Diagnosis not present

## 2022-07-01 DIAGNOSIS — N201 Calculus of ureter: Secondary | ICD-10-CM | POA: Diagnosis present

## 2022-07-01 DIAGNOSIS — Z01818 Encounter for other preprocedural examination: Secondary | ICD-10-CM

## 2022-07-01 HISTORY — DX: Family history of other specified conditions: Z84.89

## 2022-07-01 HISTORY — DX: Attention-deficit hyperactivity disorder, unspecified type: F90.9

## 2022-07-01 HISTORY — DX: Anxiety disorder, unspecified: F41.9

## 2022-07-01 HISTORY — PX: EXTRACORPOREAL SHOCK WAVE LITHOTRIPSY: SHX1557

## 2022-07-01 LAB — POCT PREGNANCY, URINE: Preg Test, Ur: NEGATIVE

## 2022-07-01 SURGERY — LITHOTRIPSY, ESWL
Anesthesia: LOCAL | Laterality: Left

## 2022-07-01 MED ORDER — DIPHENHYDRAMINE HCL 25 MG PO CAPS
ORAL_CAPSULE | ORAL | Status: AC
  Filled 2022-07-01: qty 1

## 2022-07-01 MED ORDER — DIAZEPAM 5 MG PO TABS
ORAL_TABLET | ORAL | Status: AC
  Filled 2022-07-01: qty 2

## 2022-07-01 MED ORDER — DIAZEPAM 5 MG PO TABS
10.0000 mg | ORAL_TABLET | ORAL | Status: AC
  Administered 2022-07-01: 10 mg via ORAL

## 2022-07-01 MED ORDER — CIPROFLOXACIN HCL 500 MG PO TABS
500.0000 mg | ORAL_TABLET | ORAL | Status: AC
  Administered 2022-07-01: 500 mg via ORAL

## 2022-07-01 MED ORDER — DIPHENHYDRAMINE HCL 25 MG PO CAPS
25.0000 mg | ORAL_CAPSULE | ORAL | Status: AC
  Administered 2022-07-01: 25 mg via ORAL

## 2022-07-01 MED ORDER — SODIUM CHLORIDE 0.9 % IV SOLN: INTRAVENOUS | Status: DC

## 2022-07-01 MED ORDER — OXYCODONE HCL 5 MG PO TABS: 5.0000 mg | ORAL_TABLET | ORAL | 0 refills | Status: AC | PRN

## 2022-07-01 MED ORDER — CIPROFLOXACIN HCL 500 MG PO TABS
ORAL_TABLET | ORAL | Status: AC
  Filled 2022-07-01: qty 1

## 2022-07-01 NOTE — H&P (Signed)
See scanned H&P

## 2022-07-04 ENCOUNTER — Encounter (HOSPITAL_BASED_OUTPATIENT_CLINIC_OR_DEPARTMENT_OTHER): Payer: Self-pay | Admitting: Urology
